# Patient Record
Sex: Male | Born: 1965 | Race: White | Hispanic: No | Marital: Married | Smoking: Former smoker
Health system: Southern US, Community
[De-identification: ages and names within clinical notes are randomized; demographics above are authoritative.]

## PROBLEM LIST (undated history)

## (undated) DIAGNOSIS — I10 Essential (primary) hypertension: Secondary | ICD-10-CM

## (undated) DIAGNOSIS — I2510 Atherosclerotic heart disease of native coronary artery without angina pectoris: Secondary | ICD-10-CM

## (undated) DIAGNOSIS — E669 Obesity, unspecified: Secondary | ICD-10-CM

## (undated) DIAGNOSIS — E785 Hyperlipidemia, unspecified: Secondary | ICD-10-CM

## (undated) DIAGNOSIS — E119 Type 2 diabetes mellitus without complications: Secondary | ICD-10-CM

## (undated) HISTORY — PX: CORONARY ANGIOPLASTY WITH STENT PLACEMENT: SHX49

## (undated) HISTORY — PX: LAPAROSCOPIC CHOLECYSTECTOMY: SUR755

---

## 2005-12-10 ENCOUNTER — Ambulatory Visit: Payer: Self-pay | Admitting: Family Medicine

## 2005-12-11 ENCOUNTER — Ambulatory Visit: Payer: Self-pay | Admitting: *Deleted

## 2005-12-24 ENCOUNTER — Emergency Department (HOSPITAL_COMMUNITY): Admission: EM | Admit: 2005-12-24 | Discharge: 2005-12-24 | Payer: Self-pay | Admitting: Emergency Medicine

## 2006-01-03 ENCOUNTER — Ambulatory Visit: Payer: Self-pay | Admitting: Family Medicine

## 2006-01-03 ENCOUNTER — Inpatient Hospital Stay (HOSPITAL_COMMUNITY): Admission: EM | Admit: 2006-01-03 | Discharge: 2006-01-05 | Payer: Self-pay | Admitting: Emergency Medicine

## 2006-01-03 DIAGNOSIS — I2510 Atherosclerotic heart disease of native coronary artery without angina pectoris: Secondary | ICD-10-CM | POA: Diagnosis present

## 2006-01-03 DIAGNOSIS — E119 Type 2 diabetes mellitus without complications: Secondary | ICD-10-CM

## 2006-01-03 DIAGNOSIS — E785 Hyperlipidemia, unspecified: Secondary | ICD-10-CM | POA: Diagnosis present

## 2006-01-03 DIAGNOSIS — E669 Obesity, unspecified: Secondary | ICD-10-CM | POA: Diagnosis present

## 2006-01-03 DIAGNOSIS — I10 Essential (primary) hypertension: Secondary | ICD-10-CM | POA: Diagnosis present

## 2006-05-29 IMAGING — CT CT ANGIO CHEST
2 of 5 series · 19 of 36 positions shown · IV contrast ([ID] 350)
Comparison: None.

CLINICAL DATA: Chest pain. Short of breath
 CT ANGIOGRAPHY OF CHEST:
TECHNIQUE: Multidetector CT imaging of the chest was performed during bolus injection of intravenous contrast.  Multiplanar CT angiographic image reconstructions were generated to evaluate the vascular anatomy.
 Contrast:  150 cc Omnipaque 350.

[Series 3: pe · axial · 0.82mm/px · z∈[-345,-52]mm · 18 of 519 slices shown]
[im 25/519  lung]
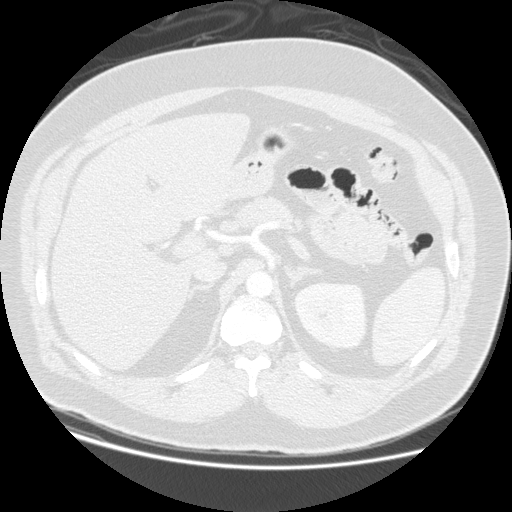
[im 50/519  mediastinal]
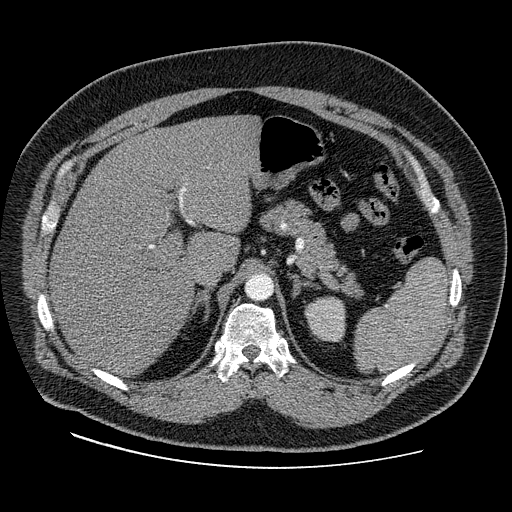
[im 75/519  lung]
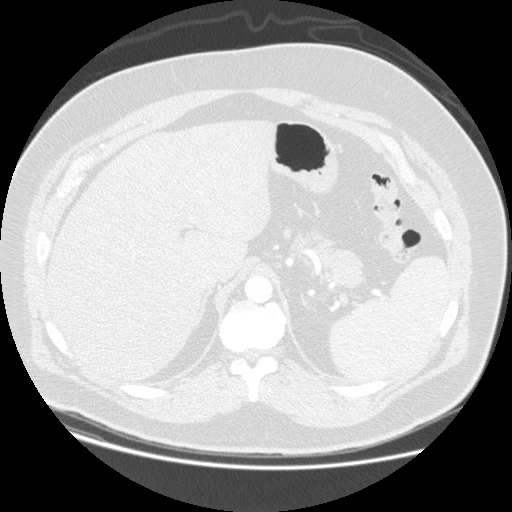
[im 124/519  mediastinal]
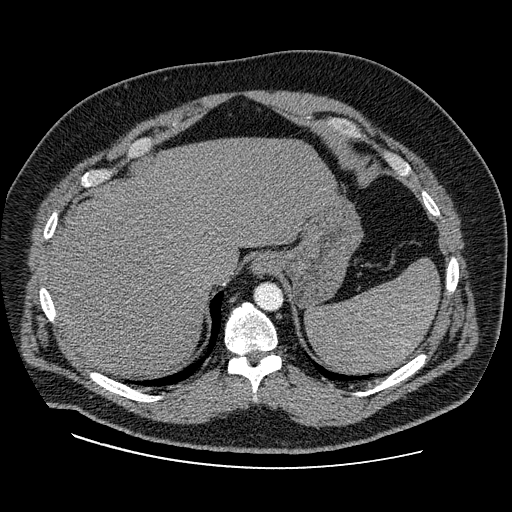
[im 149/519  lung]
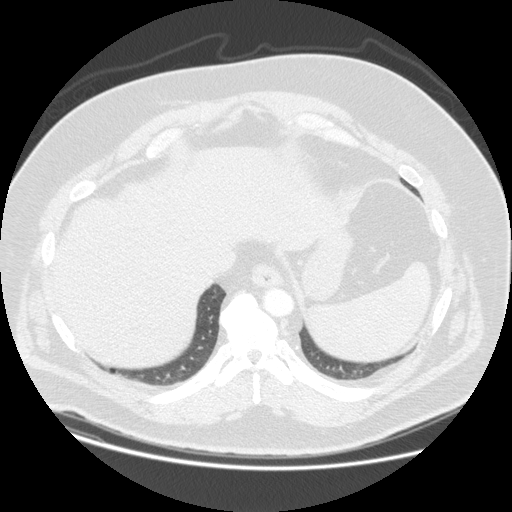
[im 173/519  mediastinal]
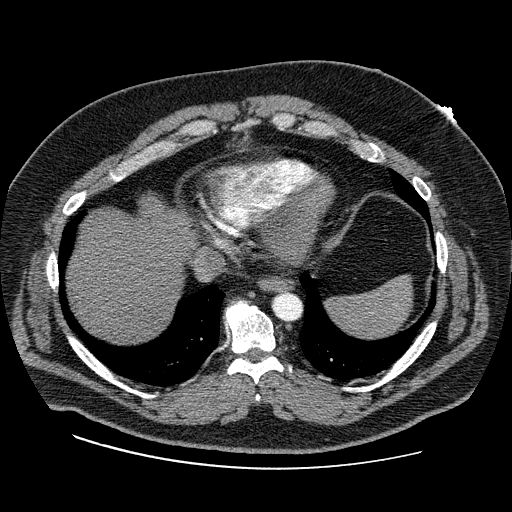
[im 198/519  lung]
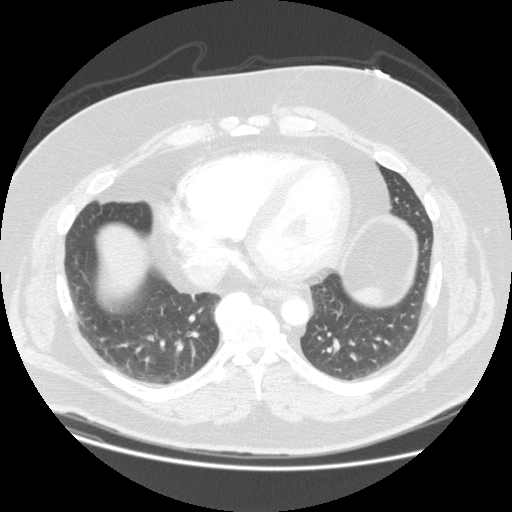
[im 223/519  mediastinal]
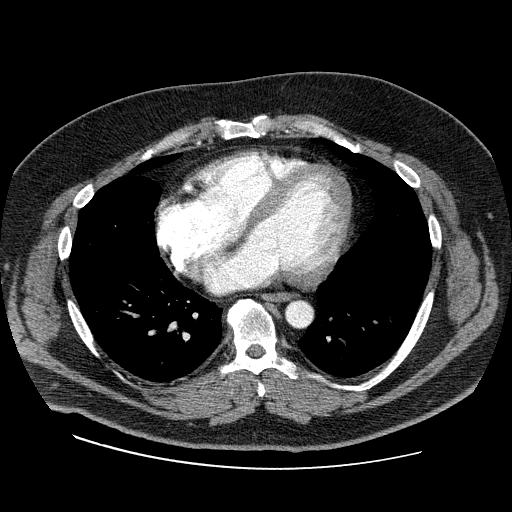
[im 247/519  lung]
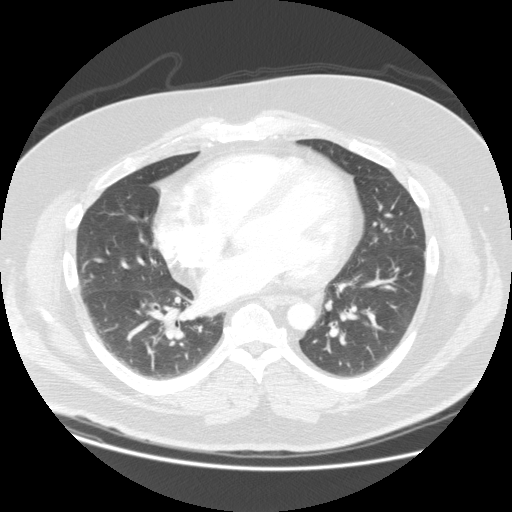
[im 297/519  mediastinal]
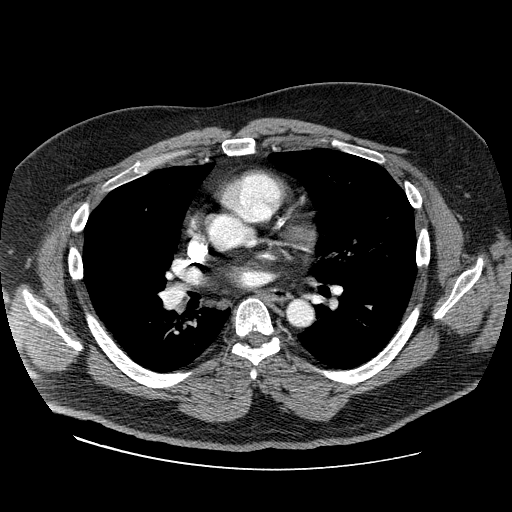
[im 303/519  lung]
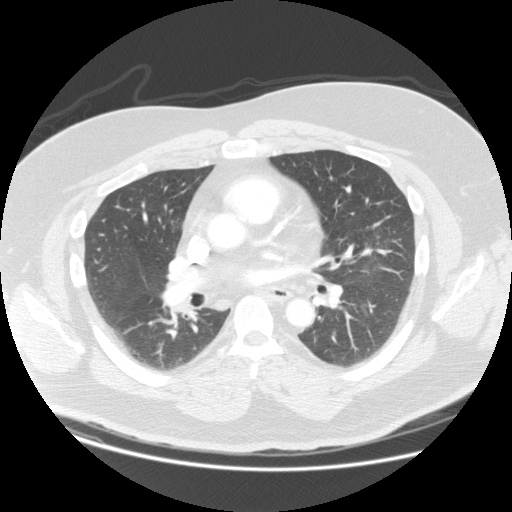
[im 321/519  mediastinal]
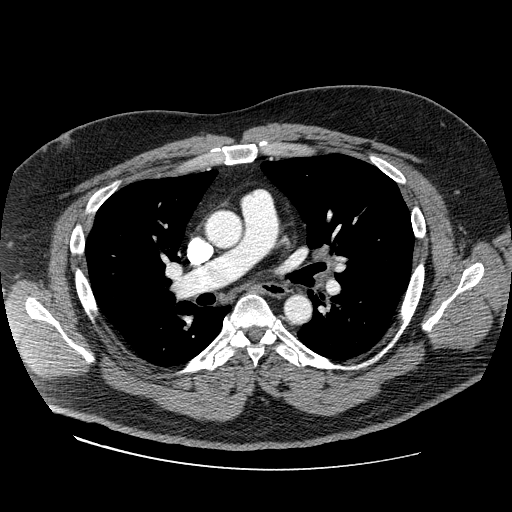
[im 346/519  lung]
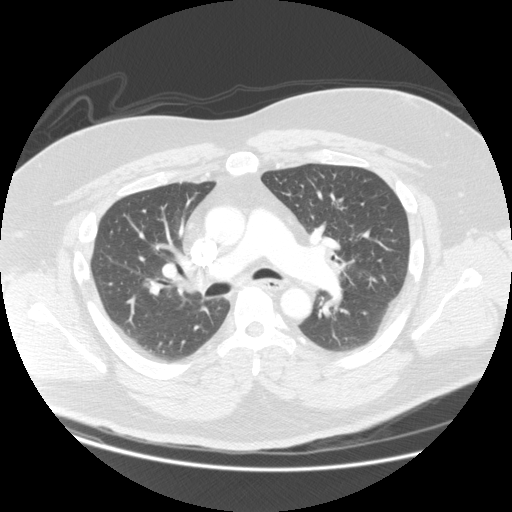
[im 371/519  mediastinal]
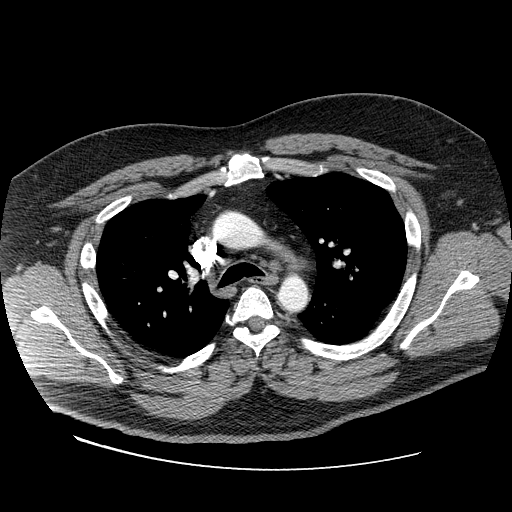
[im 395/519  lung]
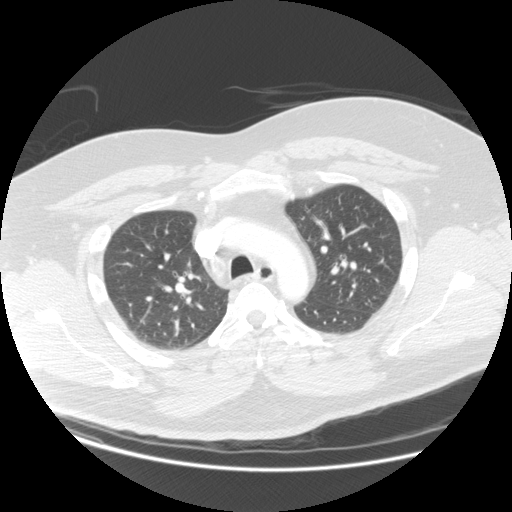
[im 445/519  mediastinal]
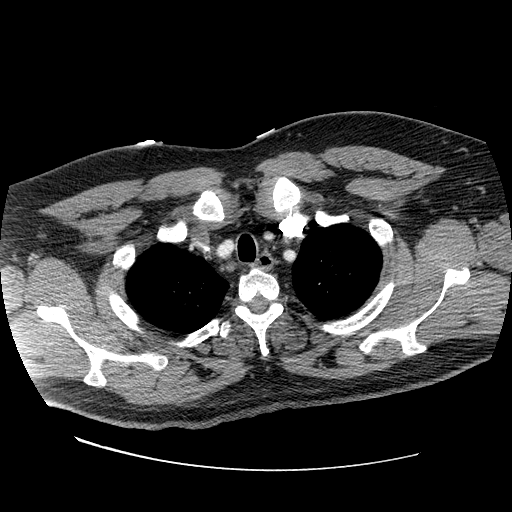
[im 469/519  lung]
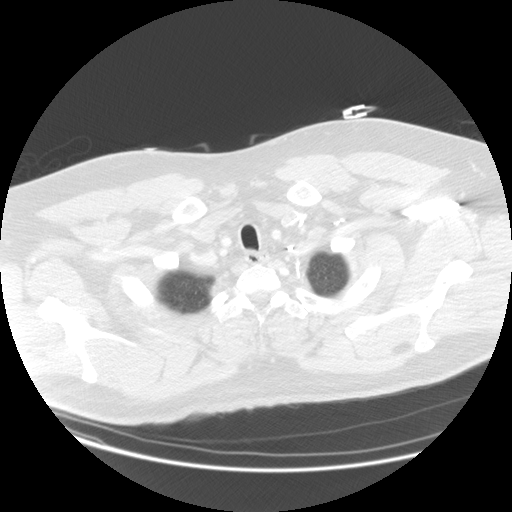
[im 494/519  mediastinal]
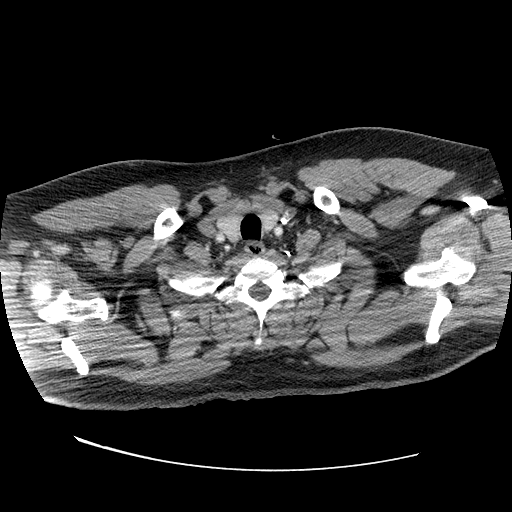

[Series 300: reformatted · sagittal · 0.82mm/px · 1 of 179 slices shown]
[im 90/179  mediastinal]
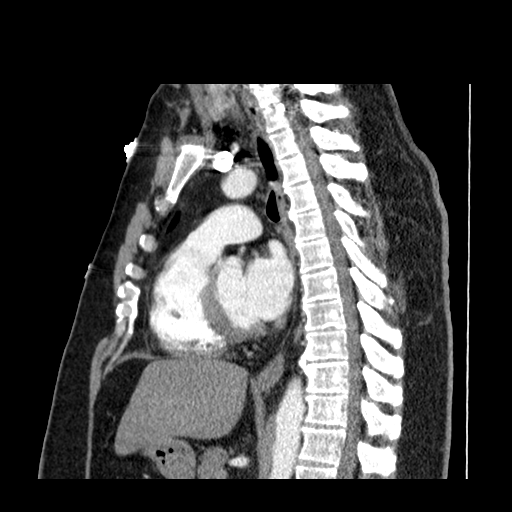

[19 of 36 positions shown; findings below may reference images not displayed]

FINDINGS: There is somewhat suboptimal filling of the pulmonary arterial tree with contrast.  No obvious filling defects are seen to suggest pulmonary thromboembolism.  
 Prominent mediastinal fat is present.  There is an 11 mm short-axis diameter right paratracheal lymph node on image 32.  Other smaller mediastinal nodes are seen.  Prominent left hilar nodes are seen.  A right hilar lymph node conglomeration on image 46 measures 2.6 x 1.7 cm.  Negative pericardial effusion.  Coronary artery calcifications are seen.  The heart is moderately enlarged.  
 No pneumothoraces or effusions are seen.  
 No focal consolidation or pulmonary mass is seen.
IMPRESSION: 1.  No evidence of pulmonary thromboembolism. 
 2.  Mediastinal lipomatosis. 
 3.  Mild hilar and mediastinal adenopathy.  Followup in three months. 
 4.  Coronary artery calcifications. 
 5.  Cardiomegaly.

## 2015-03-20 ENCOUNTER — Inpatient Hospital Stay (HOSPITAL_COMMUNITY)
Admission: EM | Admit: 2015-03-20 | Discharge: 2015-03-22 | DRG: 281 | Disposition: A | Discharge status: Home / Self Care | Payer: No Typology Code available for payment source | Attending: Internal Medicine | Admitting: Internal Medicine

## 2015-03-20 ENCOUNTER — Emergency Department (HOSPITAL_COMMUNITY): Payer: No Typology Code available for payment source

## 2015-03-20 ENCOUNTER — Encounter (HOSPITAL_COMMUNITY): Payer: Self-pay | Admitting: *Deleted

## 2015-03-20 DIAGNOSIS — F410 Panic disorder [episodic paroxysmal anxiety] without agoraphobia: Secondary | ICD-10-CM | POA: Diagnosis not present

## 2015-03-20 DIAGNOSIS — Z6837 Body mass index (BMI) 37.0-37.9, adult: Secondary | ICD-10-CM

## 2015-03-20 DIAGNOSIS — Z955 Presence of coronary angioplasty implant and graft: Secondary | ICD-10-CM | POA: Diagnosis not present

## 2015-03-20 DIAGNOSIS — I110 Hypertensive heart disease with heart failure: Secondary | ICD-10-CM | POA: Diagnosis present

## 2015-03-20 DIAGNOSIS — I10 Essential (primary) hypertension: Secondary | ICD-10-CM | POA: Diagnosis present

## 2015-03-20 DIAGNOSIS — I447 Left bundle-branch block, unspecified: Secondary | ICD-10-CM | POA: Diagnosis present

## 2015-03-20 DIAGNOSIS — E119 Type 2 diabetes mellitus without complications: Secondary | ICD-10-CM | POA: Diagnosis present

## 2015-03-20 DIAGNOSIS — E785 Hyperlipidemia, unspecified: Secondary | ICD-10-CM | POA: Diagnosis present

## 2015-03-20 DIAGNOSIS — E669 Obesity, unspecified: Secondary | ICD-10-CM | POA: Diagnosis present

## 2015-03-20 DIAGNOSIS — Z87891 Personal history of nicotine dependence: Secondary | ICD-10-CM | POA: Diagnosis not present

## 2015-03-20 DIAGNOSIS — Z7982 Long term (current) use of aspirin: Secondary | ICD-10-CM | POA: Diagnosis not present

## 2015-03-20 DIAGNOSIS — I25110 Atherosclerotic heart disease of native coronary artery with unstable angina pectoris: Secondary | ICD-10-CM | POA: Diagnosis not present

## 2015-03-20 DIAGNOSIS — I255 Ischemic cardiomyopathy: Secondary | ICD-10-CM | POA: Diagnosis present

## 2015-03-20 DIAGNOSIS — I2510 Atherosclerotic heart disease of native coronary artery without angina pectoris: Secondary | ICD-10-CM | POA: Diagnosis present

## 2015-03-20 DIAGNOSIS — I5022 Chronic systolic (congestive) heart failure: Secondary | ICD-10-CM | POA: Diagnosis not present

## 2015-03-20 DIAGNOSIS — F1320 Sedative, hypnotic or anxiolytic dependence, uncomplicated: Secondary | ICD-10-CM | POA: Diagnosis not present

## 2015-03-20 DIAGNOSIS — R079 Chest pain, unspecified: Secondary | ICD-10-CM

## 2015-03-20 DIAGNOSIS — Z91013 Allergy to seafood: Secondary | ICD-10-CM

## 2015-03-20 DIAGNOSIS — E876 Hypokalemia: Secondary | ICD-10-CM | POA: Diagnosis not present

## 2015-03-20 DIAGNOSIS — F1020 Alcohol dependence, uncomplicated: Secondary | ICD-10-CM | POA: Diagnosis not present

## 2015-03-20 DIAGNOSIS — W07XXXA Fall from chair, initial encounter: Secondary | ICD-10-CM | POA: Diagnosis present

## 2015-03-20 DIAGNOSIS — Z79899 Other long term (current) drug therapy: Secondary | ICD-10-CM

## 2015-03-20 DIAGNOSIS — I214 Non-ST elevation (NSTEMI) myocardial infarction: Secondary | ICD-10-CM | POA: Diagnosis present

## 2015-03-20 DIAGNOSIS — R7989 Other specified abnormal findings of blood chemistry: Secondary | ICD-10-CM

## 2015-03-20 DIAGNOSIS — I509 Heart failure, unspecified: Secondary | ICD-10-CM | POA: Diagnosis not present

## 2015-03-20 DIAGNOSIS — M549 Dorsalgia, unspecified: Secondary | ICD-10-CM | POA: Diagnosis not present

## 2015-03-20 DIAGNOSIS — Z794 Long term (current) use of insulin: Secondary | ICD-10-CM

## 2015-03-20 DIAGNOSIS — Z9889 Other specified postprocedural states: Secondary | ICD-10-CM | POA: Diagnosis not present

## 2015-03-20 DIAGNOSIS — Z9861 Coronary angioplasty status: Secondary | ICD-10-CM

## 2015-03-20 DIAGNOSIS — R002 Palpitations: Secondary | ICD-10-CM | POA: Diagnosis present

## 2015-03-20 DIAGNOSIS — R0789 Other chest pain: Secondary | ICD-10-CM | POA: Diagnosis not present

## 2015-03-20 DIAGNOSIS — Z7902 Long term (current) use of antithrombotics/antiplatelets: Secondary | ICD-10-CM | POA: Diagnosis not present

## 2015-03-20 DIAGNOSIS — F332 Major depressive disorder, recurrent severe without psychotic features: Secondary | ICD-10-CM | POA: Diagnosis not present

## 2015-03-20 DIAGNOSIS — R61 Generalized hyperhidrosis: Secondary | ICD-10-CM | POA: Diagnosis not present

## 2015-03-20 HISTORY — DX: Obesity, unspecified: E66.9

## 2015-03-20 HISTORY — DX: Essential (primary) hypertension: I10

## 2015-03-20 HISTORY — DX: Atherosclerotic heart disease of native coronary artery without angina pectoris: I25.10

## 2015-03-20 HISTORY — DX: Type 2 diabetes mellitus without complications: E11.9

## 2015-03-20 HISTORY — DX: Hyperlipidemia, unspecified: E78.5

## 2015-03-20 LAB — I-STAT TROPONIN, ED: TROPONIN I, POC: 0.23 ng/mL — AB (ref 0.00–0.08)

## 2015-03-20 LAB — BASIC METABOLIC PANEL
Anion gap: 16 — ABNORMAL HIGH (ref 5–15)
BUN: 12 mg/dL (ref 6–20)
CO2: 20 mmol/L — ABNORMAL LOW (ref 22–32)
Calcium: 9.4 mg/dL (ref 8.9–10.3)
Chloride: 100 mmol/L — ABNORMAL LOW (ref 101–111)
Creatinine, Ser: 0.84 mg/dL (ref 0.61–1.24)
GFR calc Af Amer: >60 mL/min (ref >60)
GLUCOSE: 188 mg/dL — AB (ref 65–99)
Potassium: 3.8 mmol/L (ref 3.5–5.1)
SODIUM: 136 mmol/L (ref 135–145)

## 2015-03-20 LAB — CBC
HCT: 42.8 % (ref 39.0–52.0)
HEMOGLOBIN: 14.5 g/dL (ref 13.0–17.0)
MCH: 31.9 pg (ref 26.0–34.0)
MCHC: 33.9 g/dL (ref 30.0–36.0)
MCV: 94.1 fL (ref 78.0–100.0)
Platelets: 243 10*3/uL (ref 150–400)
RBC: 4.55 MIL/uL (ref 4.22–5.81)
RDW: 15.2 % (ref 11.5–15.5)
WBC: 6.6 10*3/uL (ref 4.0–10.5)

## 2015-03-20 LAB — TROPONIN I: Troponin I: 0.28 ng/mL — ABNORMAL HIGH (ref <0.031)

## 2015-03-20 LAB — GLUCOSE, CAPILLARY: GLUCOSE-CAPILLARY: 137 mg/dL — AB (ref 65–99)

## 2015-03-20 MED ORDER — LISINOPRIL 40 MG PO TABS
40.0000 mg | ORAL_TABLET | Freq: Every morning | ORAL | Status: DC
  Administered 2015-03-21 – 2015-03-22 (×2): 40 mg via ORAL
  Filled 2015-03-20 (×2): qty 1

## 2015-03-20 MED ORDER — ASPIRIN EC 81 MG PO TBEC: 81.0000 mg | DELAYED_RELEASE_TABLET | Freq: Every day | ORAL | Status: DC

## 2015-03-20 MED ORDER — INSULIN ASPART 100 UNIT/ML ~~LOC~~ SOLN
0.0000 [IU] | Freq: Three times a day (TID) | SUBCUTANEOUS | Status: DC
  Administered 2015-03-21 (×3): 2 [IU] via SUBCUTANEOUS
  Administered 2015-03-22 (×2): 3 [IU] via SUBCUTANEOUS

## 2015-03-20 MED ORDER — FLUOXETINE HCL 20 MG PO CAPS
20.0000 mg | ORAL_CAPSULE | Freq: Every morning | ORAL | Status: DC
  Administered 2015-03-21 – 2015-03-22 (×2): 20 mg via ORAL
  Filled 2015-03-20 (×2): qty 1

## 2015-03-20 MED ORDER — NITROGLYCERIN 0.4 MG SL SUBL: 0.4000 mg | SUBLINGUAL_TABLET | SUBLINGUAL | Status: DC | PRN

## 2015-03-20 MED ORDER — METOPROLOL TARTRATE 1 MG/ML IV SOLN
5.0000 mg | Freq: Once | INTRAVENOUS | Status: AC
  Administered 2015-03-20: 5 mg via INTRAVENOUS
  Filled 2015-03-20: qty 5

## 2015-03-20 MED ORDER — ATORVASTATIN CALCIUM 40 MG PO TABS
40.0000 mg | ORAL_TABLET | Freq: Every evening | ORAL | Status: DC
  Administered 2015-03-20 – 2015-03-21 (×2): 40 mg via ORAL
  Filled 2015-03-20 (×2): qty 1

## 2015-03-20 MED ORDER — MORPHINE SULFATE 4 MG/ML IJ SOLN
2.0000 mg | INTRAMUSCULAR | Status: DC | PRN
  Administered 2015-03-20: 4 mg via INTRAVENOUS
  Administered 2015-03-21: 2 mg via INTRAVENOUS
  Administered 2015-03-21 (×2): 4 mg via INTRAVENOUS
  Filled 2015-03-20 (×4): qty 1

## 2015-03-20 MED ORDER — NITROGLYCERIN 0.4 MG SL SUBL
0.4000 mg | SUBLINGUAL_TABLET | SUBLINGUAL | Status: AC | PRN
  Administered 2015-03-20 (×3): 0.4 mg via SUBLINGUAL
  Filled 2015-03-20: qty 1

## 2015-03-20 MED ORDER — ASPIRIN 81 MG PO CHEW
324.0000 mg | CHEWABLE_TABLET | ORAL | Status: AC
  Administered 2015-03-20: 324 mg via ORAL
  Filled 2015-03-20: qty 4

## 2015-03-20 MED ORDER — SPIRONOLACTONE 25 MG PO TABS
25.0000 mg | ORAL_TABLET | Freq: Every day | ORAL | Status: DC
  Administered 2015-03-22: 25 mg via ORAL
  Filled 2015-03-20: qty 1

## 2015-03-20 MED ORDER — INSULIN DETEMIR 100 UNIT/ML ~~LOC~~ SOLN
50.0000 [IU] | Freq: Two times a day (BID) | SUBCUTANEOUS | Status: DC
  Administered 2015-03-20: 25 [IU] via SUBCUTANEOUS
  Administered 2015-03-21 – 2015-03-22 (×2): 50 [IU] via SUBCUTANEOUS
  Filled 2015-03-20 (×5): qty 0.5

## 2015-03-20 MED ORDER — METOPROLOL SUCCINATE ER 50 MG PO TB24
50.0000 mg | ORAL_TABLET | Freq: Every morning | ORAL | Status: DC
  Administered 2015-03-21 – 2015-03-22 (×2): 50 mg via ORAL
  Filled 2015-03-20 (×2): qty 1

## 2015-03-20 MED ORDER — ONDANSETRON HCL 4 MG/2ML IJ SOLN: 4.0000 mg | Freq: Four times a day (QID) | INTRAMUSCULAR | Status: DC | PRN

## 2015-03-20 MED ORDER — MORPHINE SULFATE 4 MG/ML IJ SOLN
4.0000 mg | Freq: Once | INTRAMUSCULAR | Status: AC
Start: 2015-03-20 — End: 2015-03-20
  Administered 2015-03-20: 4 mg via INTRAVENOUS
  Filled 2015-03-20: qty 1

## 2015-03-20 MED ORDER — HEPARIN (PORCINE) IN NACL 100-0.45 UNIT/ML-% IJ SOLN
1400.0000 [IU]/h | INTRAMUSCULAR | Status: DC
  Administered 2015-03-20: 1400 [IU]/h via INTRAVENOUS
  Filled 2015-03-20: qty 250

## 2015-03-20 MED ORDER — ASPIRIN 300 MG RE SUPP: 300.0000 mg | RECTAL | Status: AC

## 2015-03-20 MED ORDER — ACETAMINOPHEN 325 MG PO TABS
650.0000 mg | ORAL_TABLET | ORAL | Status: DC | PRN
  Administered 2015-03-21: 650 mg via ORAL
  Filled 2015-03-20: qty 2

## 2015-03-20 MED ORDER — CHLORHEXIDINE GLUCONATE CLOTH 2 % EX PADS: 6.0000 | MEDICATED_PAD | Freq: Every day | CUTANEOUS | Status: DC

## 2015-03-20 MED ORDER — ASPIRIN EC 81 MG PO TBEC: 81.0000 mg | DELAYED_RELEASE_TABLET | Freq: Every morning | ORAL | Status: DC

## 2015-03-20 MED ORDER — CLOPIDOGREL BISULFATE 75 MG PO TABS
75.0000 mg | ORAL_TABLET | Freq: Every day | ORAL | Status: DC
  Administered 2015-03-21 – 2015-03-22 (×2): 75 mg via ORAL
  Filled 2015-03-20 (×2): qty 1

## 2015-03-20 MED ORDER — HEPARIN BOLUS VIA INFUSION
4000.0000 [IU] | INTRAVENOUS | Status: AC
Start: 2015-03-20 — End: 2015-03-20
  Administered 2015-03-20: 4000 [IU] via INTRAVENOUS

## 2015-03-20 MED ORDER — PANTOPRAZOLE SODIUM 40 MG PO TBEC
40.0000 mg | DELAYED_RELEASE_TABLET | Freq: Every day | ORAL | Status: DC
  Administered 2015-03-21 – 2015-03-22 (×2): 40 mg via ORAL
  Filled 2015-03-20 (×2): qty 1

## 2015-03-20 MED ORDER — NITROGLYCERIN IN D5W 200-5 MCG/ML-% IV SOLN
10.0000 ug/min | INTRAVENOUS | Status: DC
  Administered 2015-03-20: 10 ug/min via INTRAVENOUS
  Filled 2015-03-20: qty 250

## 2015-03-20 MED ORDER — ISOSORBIDE MONONITRATE ER 60 MG PO TB24
60.0000 mg | ORAL_TABLET | Freq: Every morning | ORAL | Status: DC
  Administered 2015-03-21 – 2015-03-22 (×2): 60 mg via ORAL
  Filled 2015-03-20 (×2): qty 1

## 2015-03-20 MED ORDER — CHLORHEXIDINE GLUCONATE CLOTH 2 % EX PADS
6.0000 | MEDICATED_PAD | Freq: Once | CUTANEOUS | Status: AC
  Administered 2015-03-20: 6 via TOPICAL

## 2015-03-20 NOTE — ED Notes (Signed)
Patient transported to X-ray 

## 2015-03-20 NOTE — ED Notes (Addendum)
Dr Donnald GarrePfeiffer made aware of abnormal troponin.

## 2015-03-20 NOTE — ED Notes (Signed)
Dr Pfeiffer made aware of abnormal troponin. 

## 2015-03-20 NOTE — Progress Notes (Addendum)
ANTICOAGULATION CONSULT NOTE - Initial Consult  Pharmacy Consult for heparin  Indication: chest pain/ACS  Allergies  Allergen Reactions  . Shellfish Allergy Swelling    Patient Measurements:   Heparin Dosing Weight: 100kg  Vital Signs: Temp: 98.8 F (37.1 C) (05/22 1630) Temp Source: Oral (05/22 1630) BP: 177/112 mmHg (05/22 1715) Pulse Rate: 124 (05/22 1715)  Labs:  Recent Labs  03/20/15 1658  HGB 14.5  HCT 42.8  PLT 243  CREATININE 0.84    CrCl cannot be calculated (Unknown ideal weight.).   Medical History: Past Medical History  Diagnosis Date  . Coronary artery disease   . Hypertension   . Diabetes mellitus without complication      Assessment: [age] YOM with h/o CAD (including stents) presents with chest pain.  Troponin elevated. Pharmacy asked to dose heparin.   Today, 03/20/2015:  CBC: Hgb and pltc WNL  No anticoagulants prior to admission per med hx  Goal of Therapy:  Heparin level 0.3-0.7 units/ml Monitor platelets by anticoagulation protocol: Yes   Plan:   Heparin 4000 unit bolus then 1400 units/hr  Check 6h heparin level   Daily heparin level and CBC  Monitor for bleeding  Juliette Alcideustin Jhene Westmoreland, PharmD, BCPS.   Pager: [phone]  03/20/2015,5:31 PM

## 2015-03-20 NOTE — Progress Notes (Signed)
Pt arrived via carelink to 3W05.  Pt hypertensive and with CP and back pain 8/10, IV NTG titrated up with no relief and EKG obtained.   Pt now c/o 10/10 headache.  Pt remains hyptertensive with BP164/104, ST 104-110.  Dr. Donnie Ahoilley paged X 2 via answering service.

## 2015-03-20 NOTE — ED Provider Notes (Signed)
CSN: [id number]     Arrival date & time 03/20/15  1624 History   First MD Initiated Contact with Patient 03/20/15 1700     Chief Complaint  Patient presents with  . Palpitations   [facility] is a 49 y.o. male with a history of diabetes, coronary artery disease, multiple stents, and hypertension who presents the emergency department complaining of palpitations ongoing for the past 7 days as well as substernal chest pain starting around 12:30 PM today. The patient is complaining of substernal chest pressure that is nonradiating that he rates at an 8 out of 10. He reports his pain started while walking around the building outside today. Patient reports taking 325 mg of aspirin today. Patient reports history of benign stent was placed previously. He reports he sees [place] cardiology in [place]. He reports his last stent was done 2 weeks ago at [facility]. He is visiting [place] this weekend.  Patient also reports some dyspnea on exertion but no shortness of breath at rest. He complains of palpitations constant for the past 7 days. He reports he blacked out last week and fell on his buttocks. He reports going to [facility] 5 days ago where he was admitted for 1 night and then discharged. He reports he was still having palpitations at discharge. The patient denies fevers, chills, nausea, vomiting, abdominal pain, numbness, tingling or weakness.  (Consider location/radiation/quality/duration/timing/severity/associated sxs/prior Treatment) HPI  Past Medical History  Diagnosis Date  . Coronary artery disease   . Hypertension   . Diabetes mellitus without complication    Past Surgical History  Procedure Laterality Date  . Coronary angioplasty with stent placement     No family history on file. History  Substance Use Topics  . Smoking status: Never Smoker   . Smokeless tobacco: Not on file  . Alcohol Use: No    Review of Systems  Constitutional:  Negative for fever and chills.  HENT: Negative for congestion and sore throat.   Eyes: Negative for pain and visual disturbance.  Respiratory: Positive for shortness of breath. Negative for cough and wheezing.   Cardiovascular: Positive for chest pain and palpitations. Negative for leg swelling.  Gastrointestinal: Negative for nausea, vomiting, abdominal pain and diarrhea.  Genitourinary: Negative for dysuria.  Musculoskeletal: Negative for back pain and neck pain.       Buttocks pain  Skin: Negative for rash.  Neurological: Positive for light-headedness. Negative for weakness, numbness and headaches.      Allergies  Shellfish allergy  Home Medications   Prior to Admission medications   Medication Sig Start Date End Date Taking? Authorizing Provider  aspirin EC 81 MG tablet Take 81 mg by mouth every morning.   Yes Historical Provider, MD  atorvastatin (LIPITOR) 40 MG tablet Take 40 mg by mouth every evening.   Yes Historical Provider, MD  eplerenone (INSPRA) 50 MG tablet Take 50 mg by mouth every evening.   Yes Historical Provider, MD  FLUoxetine (PROZAC) 20 MG capsule Take 20 mg by mouth every morning.   Yes Historical Provider, MD  insulin detemir (LEVEMIR) 100 UNIT/ML injection Inject 50 Units into the skin 2 (two) times daily.   Yes Historical Provider, MD  isosorbide mononitrate (IMDUR) 60 MG 24 hr tablet Take 60 mg by mouth every morning.   Yes Historical Provider, MD  lisinopril (PRINIVIL,ZESTRIL) 40 MG tablet Take 40 mg by mouth every morning.   Yes Historical Provider, MD  metFORMIN (GLUCOPHAGE) 1000 MG tablet Take  1,000 mg by mouth 2 (two) times daily with a meal.   Yes Historical Provider, MD  metoprolol succinate (TOPROL-XL) 50 MG 24 hr tablet Take 50 mg by mouth every morning. Take with or immediately following a meal.   Yes Historical Provider, MD   BP 177/112 mmHg  Pulse 124  Temp(Src) 98.8 F (37.1 C) (Oral)  Resp 24  Ht  (1.803 m)  Wt 250 lb (113.399 kg)   BMI 34.88 kg/m2  SpO2 99% Physical Exam  Constitutional: He is oriented to person, place, and time. He appears well-developed and well-nourished. No distress.  Obese male. Nontoxic appearing.  HENT:  Head: Normocephalic and atraumatic.  Mouth/Throat: Oropharynx is clear and moist. No oropharyngeal exudate.  Eyes: Conjunctivae are normal. Pupils are equal, round, and reactive to light. Right eye exhibits no discharge. Left eye exhibits no discharge.  Neck: Neck supple. No JVD present. No tracheal deviation present.  Cardiovascular: Regular rhythm, normal heart sounds and intact distal pulses.  Exam reveals no gallop and no friction rub.   No murmur heard. Tachycardic with a heart rate of 112. Bilateral radial, posterior tibialis and dorsalis pedis pulses are intact.    Pulmonary/Chest: Effort normal and breath sounds normal. No respiratory distress. He has no wheezes. He has no rales. He exhibits no tenderness.  Lungs are clear to auscultation bilaterally. No chest tenderness.  Abdominal: Soft. Bowel sounds are normal. He exhibits no distension. There is no tenderness.  Abdomen is soft and nontender to palpation.  Musculoskeletal: He exhibits tenderness. He exhibits no edema.  No lower extremity edema or tenderness. Tenderness in area of ecchymosis to the patient's coccyx from a previous fall. No back edema, deformity or crepitus noted.  Lymphadenopathy:    He has no cervical adenopathy.  Neurological: He is alert and oriented to person, place, and time. Coordination normal.  Skin: Skin is warm and dry. No rash noted. He is not diaphoretic. No erythema. No pallor.  Psychiatric: He has a normal mood and affect. His behavior is normal.  Nursing note and vitals reviewed.   ED Course  Procedures (including critical care time) Labs Review Labs Reviewed  BASIC METABOLIC PANEL - Abnormal; Notable for the following:    Chloride 100 (*)    CO2 20 (*)    Glucose, Bld 188 (*)    Anion gap 16  (*)    All other components within normal limits  I-STAT TROPOININ, ED - Abnormal; Notable for the following:    Troponin i, poc 0.23 (*)    All other components within normal limits  CBC    Imaging Review No results found.   EKG Interpretation   Date/Time:  Sunday Mar 20 2015 16:32:52 EDT Ventricular Rate:  125 PR Interval:  136 QRS Duration: 124 QT Interval:  338 QTC Calculation: 487 R Axis:   -49 Text Interpretation:  Sinus tachycardia Nonspecific IVCD with LAD Consider  anterior infarct Abnormal T, consider ischemia, lateral leads Baseline  wander in lead(s) V4 agree. rate related changes c/w old. no STEMI  Confirmed by Donnald Garre, MD, Lebron Conners [phone]) on 03/20/2015 4:49:53 PM      Filed Vitals:   03/20/15 1659 03/20/15 1700 03/20/15 1715 03/20/15 1739  BP:  160/104 177/112   Pulse: 122 121 124   Temp:      TempSrc:      Resp: Height:     (1.803 m)  Weight:    250 lb (113.399 kg)  SpO2: 97% 97% 99%      MDM   Meds given in ED:  Medications  metoprolol (LOPRESSOR) injection 5 mg (not administered)  morphine 4 MG/ML injection 4 mg (not administered)  nitroGLYCERIN 50 mg in dextrose 5 % 250 mL (0.2 mg/mL) infusion (not administered)  heparin ADULT infusion 100 units/mL (25000 units/250 mL) (not administered)  heparin bolus via infusion 4,000 Units (not administered)  nitroGLYCERIN (NITROSTAT) SL tablet 0.4 mg (0.4 mg Sublingual Given 03/20/15 1738)    New Prescriptions   No medications on file    Final diagnoses:  Chest pain, unspecified chest pain type  Palpitations    This is a 49 y.o. male with a history of diabetes, coronary artery disease, multiple stents, and hypertension who presents the emergency department complaining of palpitations ongoing for the past 7 days as well as substernal chest pain starting around 12:30 PM today. The patient is complaining of substernal chest pressure that is nonradiating that he rates at an 8 out of 10. He  notes he passed out last week with his palpitations. He reports a history of 9 previous stents and left stents placed 2 weeks ago [facility]. On exam the patient is an obese male who is afebrile. He is tachycardic at a rate of 112. He is in sinus tachycardia on the monitor with a borderline left bundle blanch block and poor R-wave progression on EKG. His troponin is elevated at 0.23. Will start NTG, heparin drip and consult cardiology. Consulted with Dr. Donnie Ahoilley from cardiology who would like him to have [facility] and have [facility]. Dr. Johney FrameAllred is the admitting physician.  At second reevaluation the patient reports no relief with NTG and he has a headache. Dr. Donnald GarrePfeiffer ordered lopressor and morphine for pain control. Working on transfer. The patient is in agreement with admission.   This patient was discussed with and evaluated by Dr. Donnald GarrePfeiffer who agrees with assessment and plan.   CRITICAL CARE Performed by: Lawana Chambersansie,Atziry Baranski Duncan   Total critical care time: 35 minutes   Critical care time was exclusive of separately billable procedures and treating other patients.  Critical care was necessary to treat or prevent imminent or life-threatening deterioration.  Critical care was time spent personally by me on the following activities: development of treatment plan with patient and/or surrogate as well as nursing, discussions with consultants, evaluation of patient's response to treatment, examination of patient, obtaining history from patient or surrogate, ordering and performing treatments and interventions, ordering and review of laboratory studies, ordering and review of radiographic studies, pulse oximetry and re-evaluation of patient's condition.    Everlene FarrierWilliam Raymonda Pell, PA-C 03/20/15 2039  Arby BarretteMarcy Pfeiffer, MD 03/22/15 [phone]

## 2015-03-20 NOTE — ED Notes (Signed)
Pt reports elevated BP-200/70 and palpitations today.  Reports he's been having palpitations for the last 5 days and was seen at [facility] since.  Pt reports dizziness and lightheaded.

## 2015-03-20 NOTE — ED Notes (Signed)
[facility] called for transport. 

## 2015-03-20 NOTE — H&P (Signed)
History and Physical   Admit date: 03/20/2015 Name:  Jake KirschnerDavid Mayo Medical record number: [id number] DOB/Age:  Mar 07, 1966  49 y.o. male  Referring Physician: [facility]  Chief complaint/reason for admission: Palpitations, chest pain and shortness of breath.    HPI:  This 49 year old male is brought in for observation with chest pain, palpitations, and shortness of breath.  The patient is visiting his parents who live at [facility] from out of town.  He has a complicated past history.  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  He reports having [facility] st[facility] a number of years ago and states that he has 9 stents.  He reports having had 2 st[place] [facility] [place] for chest pain.  He has had shortness of breath and states he has diabetes as well as congestive heart failure.  He says that he lives alone and supports himself financially with support of his family.  He says he quit smoking a year or 2 ago.  He was [facility] stating that he became dizzy his heart was racing and he fell out of a chair and hurt his tailbone.  He had come to [place] to visit his parents and was walking around the complex this morning when he had chest discomfort and came in.  He evidently was checked by a nurse who told him he did not look good and he was sent to [facility].  He was complaining mainly of palpitations but also had some chest pain earlier and his troponin was drawn and was positive.  He was placed on heparin and nitroglycerin and [facility].  He at the present time is complaining that his heart is racing and complains of palpitations and he also complains of chest pain as well as shortness of breath as well as a headache.  It is hard to tell what the predominant complaint is.  He denies any history of drug  use.  Normally has shortness of breath and difficulty walking.  He states he was recently placed on metformin and has been having diarrhea.  Says that his heart has been racing for about a week and was associated with syncope previously.   Past Medical History  Diagnosis Date  . Coronary artery disease   . Hypertension   . Diabetes mellitus without complication   . Obesity (BMI 30-39.9)   . Hyperlipidemia      Past Surgical History  Procedure Laterality Date  . Coronary angioplasty with stent placement    . Laparoscopic cholecystectomy    .  Allergies: is allergic to shellfish allergy.   Medications: Prior to Admission medications   Medication Sig Start Date End Date Taking? Authorizing Provider  aspirin EC 81 MG tablet Take 81 mg by mouth every morning.   Yes Historical Provider, MD  atorvastatin (LIPITOR) 40 MG tablet Take 40 mg by mouth every evening.   Yes Historical Provider, MD  eplerenone (INSPRA) 50 MG tablet Take 50 mg by mouth every evening.   Yes Historical Provider, MD  FLUoxetine (PROZAC) 20 MG capsule Take 20 mg by mouth every morning.   Yes Historical Provider, MD  insulin detemir (LEVEMIR) 100 UNIT/ML injection Inject 50 Units into the skin 2 (two) times daily.   Yes Historical Provider, MD  isosorbide mononitrate (IMDUR) 60 MG 24 hr  tablet Take 60 mg by mouth every morning.   Yes Historical Provider, MD  lisinopril (PRINIVIL,ZESTRIL) 40 MG tablet Take 40 mg by mouth every morning.   Yes Historical Provider, MD  metFORMIN (GLUCOPHAGE) 1000 MG tablet Take 1,000 mg by mouth 2 (two) times daily with a meal.   Yes Historical Provider, MD  metoprolol succinate (TOPROL-XL) 50 MG 24 hr tablet Take 50 mg by mouth every morning. Take with or immediately following a meal.   Yes Historical Provider, MD    Family History:  Family Status  Relation Status Death Age  . Father Alive     dementia  . Mother Alive     Social History:   reports that he has quit smoking. His  smoking use included Cigarettes. He has a 30 pack-year smoking history. He has never used smokeless tobacco. He reports that he does not drink alcohol or use illicit drugs.   History   Social History Narrative   Truck driver        Review of Systems: Complains of chronic fatigue and malaise.  Chronic shortness of breath.  Has had diarrhea since being on metformin.  Did not bring all of his medications with him but states he has been compliant with [facility].  He does have recent vein stripping on his left leg that was done in [place] at some point.  No history of stroke or TIA.  Does have mild indigestion at times.  Also has nocturia. Other than as noted above, the remainder of the review of systems is normal  Physical Exam: BP 152/88 mmHg  Pulse 109  Temp(Src) 98.6 F (37 C) (Oral)  Resp 21  Ht  (1.803 m)  Wt 122.653 kg (270 lb 6.4 oz)  BMI 37.73 kg/m2  SpO2 96% General appearance: Obese white male who is uncomfortable lying in bed mainly due to low back pain Head: Normocephalic, without obvious abnormality, atraumatic Eyes: conjunctivae/corneas clear. PERRL, EOM's intact. Fundi not examined Neck: no adenopathy, no carotid bruit, no JVD and supple, symmetrical, trachea midline Lungs: clear to auscultation bilaterally Heart: regular rate and rhythm, S1, S2 normal, no murmur, click, rub or gallop Abdomen: soft, non-tender; bowel sounds normal; no masses,  no organomegaly Rectal: deferred Extremities: Left lower leg has significant varicose veins noted in the upper leg, healed anterior surgical scar with mild deformity, changes of chronic venous stasis in the lower leg, right leg with no edema Pulses: 2+ and symmetric Skin: Skin color, texture, turgor normal. No rashes or lesions Neurologic: Grossly normal  Labs: CBC  Recent Labs  03/20/15 1658  WBC 6.6  RBC 4.55  HGB 14.5  HCT 42.8  PLT 243  MCV 94.1  MCH 31.9  MCHC 33.9  RDW 15.2   CMP   Recent Labs   03/20/15 1658  NA 136  K 3.8  CL 100*  CO2 20*  GLUCOSE 188*  BUN 12  CREATININE 0.84  CALCIUM 9.4  GFRNONAA >60  GFRAA >60   BNP (last 3 results)  Cardiac Panel (last 3 results) Troponin (Point of Care Test)  Recent Labs  03/20/15 1702  TROPIPOC 0.23*    EKG: Left bundle branch block   Radiology: Cardiomegaly, no congestive heart failure   IMPRESSIONS: 1.  Prolonged chest discomfort with mildly elevated troponin in a patient with unknown stents to unknown vessels with previous stenting about 3 weeks ago 2.  Recent syncopal episode 3.  Left bundle branch block 4.  Diabetes mellitus 5.  Hypertensive  heart disease 6.  History of congestive heart failure 7.  Obesity  PLAN: Very difficult to clinically assess.  It is hard to tell whether his major complaint is chest pain headache or palpitations.  We'll cycle troponins and try to obtain medical records.  Keep nothing by mouth after midnight for further testing.  Obtain echocardiogram.  Signed: Darden Palmer MD [facility]  03/20/2015, 10:08 PM

## 2015-03-21 ENCOUNTER — Encounter (HOSPITAL_COMMUNITY): Payer: Self-pay | Admitting: Interventional Cardiology

## 2015-03-21 ENCOUNTER — Encounter (HOSPITAL_COMMUNITY)
Admission: EM | Disposition: A | Discharge status: Home / Self Care | Payer: No Typology Code available for payment source | Source: Home / Self Care | Attending: Internal Medicine

## 2015-03-21 ENCOUNTER — Inpatient Hospital Stay (HOSPITAL_COMMUNITY): Payer: No Typology Code available for payment source

## 2015-03-21 DIAGNOSIS — I509 Heart failure, unspecified: Secondary | ICD-10-CM

## 2015-03-21 DIAGNOSIS — R002 Palpitations: Secondary | ICD-10-CM | POA: Diagnosis present

## 2015-03-21 DIAGNOSIS — I25110 Atherosclerotic heart disease of native coronary artery with unstable angina pectoris: Secondary | ICD-10-CM

## 2015-03-21 DIAGNOSIS — E876 Hypokalemia: Secondary | ICD-10-CM

## 2015-03-21 HISTORY — PX: CARDIAC CATHETERIZATION: SHX172

## 2015-03-21 LAB — BASIC METABOLIC PANEL
ANION GAP: 12 (ref 5–15)
BUN: 9 mg/dL (ref 6–20)
CO2: 25 mmol/L (ref 22–32)
Calcium: 8.6 mg/dL — ABNORMAL LOW (ref 8.9–10.3)
Chloride: 100 mmol/L — ABNORMAL LOW (ref 101–111)
Creatinine, Ser: 0.8 mg/dL (ref 0.61–1.24)
GFR calc non Af Amer: >60 mL/min (ref >60)
Glucose, Bld: 154 mg/dL — ABNORMAL HIGH (ref 65–99)
Potassium: 3.3 mmol/L — ABNORMAL LOW (ref 3.5–5.1)
Sodium: 137 mmol/L (ref 135–145)

## 2015-03-21 LAB — TROPONIN I
Troponin I: 0.23 ng/mL — ABNORMAL HIGH (ref <0.031)
Troponin I: 0.17 ng/mL — ABNORMAL HIGH (ref <0.031)

## 2015-03-21 LAB — TSH: TSH: 1.375 u[IU]/mL (ref 0.350–4.500)

## 2015-03-21 LAB — LIPID PANEL
CHOL/HDL RATIO: 3.4 ratio
CHOLESTEROL: 141 mg/dL (ref 0–200)
HDL: 42 mg/dL (ref >40)
LDL CALC: 21 mg/dL (ref 0–99)
Triglycerides: 388 mg/dL — ABNORMAL HIGH (ref <150)
VLDL: 78 mg/dL — ABNORMAL HIGH (ref 0–40)

## 2015-03-21 LAB — GLUCOSE, CAPILLARY
Glucose-Capillary: 147 mg/dL — ABNORMAL HIGH (ref 65–99)
Glucose-Capillary: 145 mg/dL — ABNORMAL HIGH (ref 65–99)
Glucose-Capillary: 150 mg/dL — ABNORMAL HIGH (ref 65–99)
Glucose-Capillary: 151 mg/dL — ABNORMAL HIGH (ref 65–99)

## 2015-03-21 LAB — CBC
HCT: 39.2 % (ref 39.0–52.0)
Hemoglobin: 13.6 g/dL (ref 13.0–17.0)
MCH: 32.2 pg (ref 26.0–34.0)
MCHC: 34.7 g/dL (ref 30.0–36.0)
MCV: 92.7 fL (ref 78.0–100.0)
PLATELETS: 210 10*3/uL (ref 150–400)
RBC: 4.23 MIL/uL (ref 4.22–5.81)
RDW: 14.7 % (ref 11.5–15.5)
WBC: 4.3 10*3/uL (ref 4.0–10.5)

## 2015-03-21 LAB — PROTIME-INR
INR: 1.15 (ref 0.00–1.49)
PROTHROMBIN TIME: 14.9 s (ref 11.6–15.2)

## 2015-03-21 LAB — HEPARIN LEVEL (UNFRACTIONATED)
HEPARIN UNFRACTIONATED: 0.29 [IU]/mL — AB (ref 0.30–0.70)
HEPARIN UNFRACTIONATED: 0.1 [IU]/mL — AB (ref 0.30–0.70)

## 2015-03-21 LAB — MRSA PCR SCREENING: MRSA by PCR: NEGATIVE

## 2015-03-21 SURGERY — LEFT HEART CATH AND CORONARY ANGIOGRAPHY

## 2015-03-21 SURGERY — LEFT HEART CATH AND CORONARY ANGIOGRAPHY
Anesthesia: LOCAL

## 2015-03-21 MED ORDER — ENOXAPARIN SODIUM 40 MG/0.4ML ~~LOC~~ SOLN
40.0000 mg | SUBCUTANEOUS | Status: DC
  Administered 2015-03-22: 40 mg via SUBCUTANEOUS
  Filled 2015-03-21: qty 0.4

## 2015-03-21 MED ORDER — SODIUM CHLORIDE 0.9 % IV SOLN: 250.0000 mL | INTRAVENOUS | Status: DC | PRN

## 2015-03-21 MED ORDER — ONDANSETRON HCL 4 MG/2ML IJ SOLN: 4.0000 mg | Freq: Four times a day (QID) | INTRAMUSCULAR | Status: DC | PRN

## 2015-03-21 MED ORDER — FENTANYL CITRATE (PF) 100 MCG/2ML IJ SOLN
INTRAMUSCULAR | Status: AC
  Filled 2015-03-21: qty 2

## 2015-03-21 MED ORDER — FENTANYL CITRATE (PF) 100 MCG/2ML IJ SOLN
INTRAMUSCULAR | Status: DC | PRN
  Administered 2015-03-21 (×3): 50 ug via INTRAVENOUS

## 2015-03-21 MED ORDER — OXYCODONE-ACETAMINOPHEN 5-325 MG PO TABS
1.0000 | ORAL_TABLET | ORAL | Status: DC | PRN
  Administered 2015-03-21 – 2015-03-22 (×6): 2 via ORAL
  Filled 2015-03-21 (×6): qty 2

## 2015-03-21 MED ORDER — SODIUM CHLORIDE 0.9 % IJ SOLN: 3.0000 mL | INTRAMUSCULAR | Status: DC | PRN

## 2015-03-21 MED ORDER — HEPARIN (PORCINE) IN NACL 100-0.45 UNIT/ML-% IJ SOLN
1700.0000 [IU]/h | INTRAMUSCULAR | Status: DC
  Administered 2015-03-21: 1700 [IU]/h via INTRAVENOUS
  Filled 2015-03-21: qty 250

## 2015-03-21 MED ORDER — HEPARIN BOLUS VIA INFUSION
3000.0000 [IU] | Freq: Once | INTRAVENOUS | Status: AC
  Administered 2015-03-21: 3000 [IU] via INTRAVENOUS
  Filled 2015-03-21: qty 3000

## 2015-03-21 MED ORDER — CLOPIDOGREL BISULFATE 75 MG PO TABS: 75.0000 mg | ORAL_TABLET | Freq: Every day | ORAL | Status: DC

## 2015-03-21 MED ORDER — HEPARIN SODIUM (PORCINE) 1000 UNIT/ML IJ SOLN
INTRAMUSCULAR | Status: AC
  Filled 2015-03-21: qty 1

## 2015-03-21 MED ORDER — MIDAZOLAM HCL 2 MG/2ML IJ SOLN
INTRAMUSCULAR | Status: AC
  Filled 2015-03-21: qty 2

## 2015-03-21 MED ORDER — INSULIN DETEMIR 100 UNIT/ML ~~LOC~~ SOLN
25.0000 [IU] | Freq: Once | SUBCUTANEOUS | Status: AC
  Administered 2015-03-21: 25 [IU] via SUBCUTANEOUS
  Filled 2015-03-21: qty 0.25

## 2015-03-21 MED ORDER — SODIUM CHLORIDE 0.9 % IJ SOLN: 3.0000 mL | Freq: Two times a day (BID) | INTRAMUSCULAR | Status: DC

## 2015-03-21 MED ORDER — HEPARIN (PORCINE) IN NACL 2-0.9 UNIT/ML-% IJ SOLN
INTRAMUSCULAR | Status: AC
  Filled 2015-03-21: qty 1500

## 2015-03-21 MED ORDER — ASPIRIN 81 MG PO CHEW
81.0000 mg | CHEWABLE_TABLET | Freq: Every day | ORAL | Status: DC
  Administered 2015-03-22: 81 mg via ORAL
  Filled 2015-03-21: qty 1

## 2015-03-21 MED ORDER — ACETAMINOPHEN 325 MG PO TABS: 650.0000 mg | ORAL_TABLET | ORAL | Status: DC | PRN

## 2015-03-21 MED ORDER — SODIUM CHLORIDE 0.9 % IJ SOLN
3.0000 mL | Freq: Two times a day (BID) | INTRAMUSCULAR | Status: DC
  Administered 2015-03-21 – 2015-03-22 (×2): 3 mL via INTRAVENOUS

## 2015-03-21 MED ORDER — LIDOCAINE HCL (PF) 1 % IJ SOLN
INTRAMUSCULAR | Status: AC
  Filled 2015-03-21: qty 30

## 2015-03-21 MED ORDER — IOHEXOL 350 MG/ML SOLN
INTRAVENOUS | Status: DC | PRN
  Administered 2015-03-21: 100 mL via INTRA_ARTERIAL

## 2015-03-21 MED ORDER — POTASSIUM CHLORIDE CRYS ER 20 MEQ PO TBCR
40.0000 meq | EXTENDED_RELEASE_TABLET | Freq: Once | ORAL | Status: AC
  Administered 2015-03-21: 40 meq via ORAL
  Filled 2015-03-21: qty 2

## 2015-03-21 MED ORDER — HEPARIN SODIUM (PORCINE) 1000 UNIT/ML IJ SOLN
INTRAMUSCULAR | Status: DC | PRN
  Administered 2015-03-21: 6000 [IU] via INTRAVENOUS

## 2015-03-21 MED ORDER — SODIUM CHLORIDE 0.9 % WEIGHT BASED INFUSION
3.0000 mL/kg/h | INTRAVENOUS | Status: AC
  Administered 2015-03-21 (×2): 3 mL/kg/h via INTRAVENOUS

## 2015-03-21 MED ORDER — VERAPAMIL HCL 2.5 MG/ML IV SOLN
INTRAVENOUS | Status: AC
  Filled 2015-03-21: qty 2

## 2015-03-21 MED ORDER — NITROGLYCERIN 1 MG/10 ML FOR IR/CATH LAB
INTRA_ARTERIAL | Status: AC
  Filled 2015-03-21: qty 10

## 2015-03-21 MED ORDER — SODIUM CHLORIDE 0.9 % IV SOLN: INTRAVENOUS | Status: DC

## 2015-03-21 MED ORDER — ZOLPIDEM TARTRATE 5 MG PO TABS
5.0000 mg | ORAL_TABLET | Freq: Every evening | ORAL | Status: DC | PRN
  Administered 2015-03-21: 5 mg via ORAL
  Filled 2015-03-21: qty 1

## 2015-03-21 MED ORDER — VERAPAMIL HCL 2.5 MG/ML IV SOLN
INTRAVENOUS | Status: DC | PRN
  Administered 2015-03-21: 11:00:00 via INTRA_ARTERIAL

## 2015-03-21 MED ORDER — MIDAZOLAM HCL 2 MG/2ML IJ SOLN
INTRAMUSCULAR | Status: DC | PRN
  Administered 2015-03-21 (×3): 1 mg via INTRAVENOUS

## 2015-03-21 SURGICAL SUPPLY — 12 items
CATH INFINITI 5 FR JL3.5 (CATHETERS) ×2 IMPLANT
CATH INFINITI JR4 5F (CATHETERS) ×2 IMPLANT
CATH SITESEER 5F MULTI A 2 (CATHETERS) IMPLANT
DEVICE RAD COMP TR BAND LRG (VASCULAR PRODUCTS) ×2 IMPLANT
GLIDESHEATH SLEND A-KIT 6F 22G (SHEATH) ×2 IMPLANT
KIT HEART LEFT (KITS) ×2 IMPLANT
PACK CARDIAC CATHETERIZATION (CUSTOM PROCEDURE TRAY) ×2 IMPLANT
SHEATH PINNACLE 5F 10CM (SHEATH) IMPLANT
TRANSDUCER W/STOPCOCK (MISCELLANEOUS) ×2 IMPLANT
TUBING CIL FLEX 10 FLL-RA (TUBING) ×2 IMPLANT
WIRE EMERALD 3MM-J .035X150CM (WIRE) IMPLANT
WIRE SAFE-T 1.5MM-J .035X260CM (WIRE) ×2 IMPLANT

## 2015-03-21 NOTE — Interval H&P Note (Signed)
Cath Lab Visit (complete for each Cath Lab visit)  Clinical Evaluation Leading to the Procedure:   ACS: Yes.    Non-ACS:    Anginal Classification: CCS III  Anti-ischemic medical therapy: Maximal Therapy (2 or more classes of medications)  Non-Invasive Test Results: No non-invasive testing performed  Prior CABG: No previous CABG      History and Physical Interval Note:  03/21/2015 10:28 AM  Jake Kirschneravid Witczak  has presented today for surgery, with the diagnosis of cp  The various methods of treatment have been discussed with the patient and family. After consideration of risks, benefits and other options for treatment, the patient has consented to  Procedure(s): Left Heart Cath and Coronary Angiography (N/A) as a surgical intervention .  The patient's history has been reviewed, patient examined, no change in status, stable for surgery.  I have reviewed the patient's chart and labs.  Questions were answered to the patient's satisfaction.     Lesleigh NoeSMITH III,HENRY W

## 2015-03-21 NOTE — Progress Notes (Signed)
ANTICOAGULATION CONSULT NOTE - Follow up Pharmacy Consult for heparin  Indication: chest pain/ACS  Allergies  Allergen Reactions  . Shellfish Allergy Swelling    Patient Measurements: Height: 5\' 11"  (180.3 cm) Weight: 270 lb 6.4 oz (122.653 kg) IBW/kg (Calculated) : 75.3 Heparin Dosing Weight: 100kg  Vital Signs: Temp: 97.5 F (36.4 C) (05/23 0719) Temp Source: Oral (05/23 0719) BP: 170/98 mmHg (05/23 0900) Pulse Rate: 91 (05/23 0900)  Labs:  Recent Labs  03/20/15 1658 03/20/15 2315 03/21/15 0030 03/21/15 0448 03/21/15 0815  HGB 14.5  --   --  13.6  --   HCT 42.8  --   --  39.2  --   PLT 243  --   --  210  --   HEPARINUNFRC  --   --  0.10*  --  0.29*  CREATININE 0.84  --   --  0.80  --   TROPONINI  --  0.28*  --  0.23*  --     Estimated Creatinine Clearance: 147.3 mL/min (by C-G formula based on Cr of 0.8).   Medical History: Past Medical History  Diagnosis Date  . Coronary artery disease   . Hypertension   . Diabetes mellitus without complication   . Obesity (BMI 30-39.9)   . Hyperlipidemia      Assessment: Heparin level = 0.29 on IV heparin rate 1700 units/hr for chest pian/ACS in this 49 y.o male.  h/o CAD (including stents) presents with chest pain.  Troponin elevated. No bleeding noted. H/H and Plt wnl. Patient is now going to cath lab. Heparin infusion has been turned off per RN.   Goal of Therapy:  Heparin level 0.3-0.7 units/ml Monitor platelets by anticoagulation protocol: Yes   Plan:  -F/u after cath.   Vinnie LevelBenjamin Cortlan Dolin, PharmD., BCPS Clinical Pharmacist Pager [phone]

## 2015-03-21 NOTE — Progress Notes (Signed)
  Echocardiogram 2D Echocardiogram has been performed.  Arvil ChacoFoster, Kermitt Harjo 03/21/2015, 12:25 PM

## 2015-03-21 NOTE — Progress Notes (Addendum)
Patient has been having significant sacral pain after recent fall prior to admission. He does have some bruise around the area. He complain of lower back pain. However he states he would not tolerate CT given severe claustrophobia. We will manage with pain medication for now. However if lower back pain, continue to worsen, may need benzo and CT scan.   Will add CBC to tomorrow's lab  Signed, Azalee CourseHao Tu Bayle PA Pager: [phone]

## 2015-03-21 NOTE — Progress Notes (Signed)
Utilization review complete. Tiphany Fayson RN CCM Case Mgmt phone [phone] 

## 2015-03-21 NOTE — Progress Notes (Signed)
Patient Name: Jake Mayo Date of Encounter: 03/21/2015   SUBJECTIVE  Pt complaining of palpitation and chest pain. Pt did not slept last night due to palpitation. Denies SOB. Current chest pain 8/10. Describes it as squishing, however not similar to previous cardiac pain. Fell 4 weeks ago and stated that he had negative workup at ER in [place]. 10/10 back/ Buttock pain. Primary cardiologist Dr. Greer Pickerel @ [facility], [place], [place].   CURRENT MEDS . aspirin EC  81 mg Oral Daily  . atorvastatin  40 mg Oral QPM  . clopidogrel  75 mg Oral Q breakfast  . FLUoxetine  20 mg Oral q morning - 10a  . insulin aspart  0-15 Units Subcutaneous TID WC  . insulin detemir  50 Units Subcutaneous BID  . isosorbide mononitrate  60 mg Oral q morning - 10a  . lisinopril  40 mg Oral q morning - 10a  . metoprolol succinate  50 mg Oral q morning - 10a  . pantoprazole  40 mg Oral Q1200  . spironolactone  25 mg Oral Daily    OBJECTIVE  Filed Vitals:   03/21/15 0140 03/21/15 0400 03/21/15 0500 03/21/15 0719  BP: 156/91 133/79 141/86 155/98  Pulse: 104  95 88  Temp:   98.7 F (37.1 C) 97.5 F (36.4 C)  TempSrc:   Oral Oral  Resp: Height:      Weight:      SpO2: 98%  97% 97%    Intake/Output Summary (Last 24 hours) at 03/21/15 0743 Last data filed at 03/21/15 0300  Gross per 24 hour  Intake 425.29 ml  Output      0 ml  Net 425.29 ml   Filed Weights   03/20/15 1739 03/20/15 2100  Weight: 250 lb (113.399 kg) 270 lb 6.4 oz (122.653 kg)    PHYSICAL EXAM  General: Pleasant, Obese male, appears to be in pain.  Neuro: Alert and oriented X 3. Moves all extremities spontaneously. Psych: Normal affect. HEENT:  Normal  Neck: Supple without bruits or JVD. Lungs:  Resp regular and unlabored, CTA. Heart: regular rhythm with tachycardia.  no s3, s4, or murmurs. Abdomen: Soft, non-tender, non-distended, BS + x 4.  Extremities: No clubbing, cyanosis or edema. DP/PT/Radials 2+ and  equal bilaterally. Back: Tender lower back. Bruise Left buttock.  Accessory Clinical Findings  CBC  Recent Labs  03/20/15 1658 03/21/15 0448  WBC 6.6 4.3  HGB 14.5 13.6  HCT 42.8 39.2  MCV 94.1 92.7  PLT 243 210   Basic Metabolic Panel  Recent Labs  03/20/15 1658 03/21/15 0448  NA 136 137  K 3.8 3.3*  CL 100* 100*  CO2 20* 25  GLUCOSE 188* 154*  BUN 12 9  CREATININE 0.84 0.80  CALCIUM 9.4 8.6*   Liver Function Tests  Cardiac Enzymes  Recent Labs  03/20/15 2315 03/21/15 0448  TROPONINI 0.28* 0.23*     Recent Labs  03/21/15 0448  CHOL 141  HDL 42  LDLCALC 21  TRIG 388*  CHOLHDL 3.4   Thyroid Function Tests  Recent Labs  03/20/15 2315  TSH 1.375    TELE  Normal sinus rhythm with tachycardia and occasional PVCs.  Radiology/Studies  Dg Chest 2 View  03/20/2015   CLINICAL DATA:  Chest pain, weakness, palpitations.  Hypertension.  EXAM: CHEST  2 VIEW  COMPARISON:  01/03/2006; chest CT - 01/03/2006  FINDINGS: Grossly unchanged enlarged cardiac silhouette and mediastinal contours. No focal airspace opacities. No  pleural effusion or pneumothorax. No evidence of edema. There is persistent mild elevation of the bilateral hemidiaphragms. No pleural effusion or pneumothorax. No acute osseous abnormalities. Stigmata of dish within the caudal aspect of the thoracic spine.  IMPRESSION: Cardiomegaly without acute cardiopulmonary disease.   Electronically Signed   By: Simonne ComeJohn  Watts M.D.   On: 03/20/2015 18:24    ASSESSMENT AND PLAN Active Problems:   Non-STEMI (non-ST elevated myocardial infarction)    1. NSTEMI - Trop trend  0.23-->0.28-->0.23. Complaining of squishing chest. No nitro drip. Recent cath 3 weeks ago.  - Pt is NPO. His pain is concerning. Will proceed with cath. Increase nitro gtt and morphine.  Tele showed normal sinus rhythm with tachycardia and occasional PVCs. EKG today TWI in lead V4 to V6, LBBB? - TSH normal. - Continue ASA, heparin,  lipitor, plavix, imdur, lisinopril, spironolactone.   2. CAD s/p multiple stents, most recent 3 weeks ago - As above.  3. HTN - BP 155/98. Increase nitro gtt. Continue meds as above.   4. DM - continue insulin.   5. Hypokalemia - K 3.3 this AM. Supplement given.   6. L buttock bruise - Fell 4 days ago, had negative workup. Continue to monitor.    Signed, Manson PasseyBhagat,Bhavinkumar PA-C   Attending Note:   The patient was seen and examined.  Agree with assessment and plan as noted above.  Changes made to the above note as needed.  Pt has now returned from the cath lab Has no obstructive CAD.  Stents are patent. Will observe overnight and anticipate D C tomorrow.  Given low EF , will place an event monitor on him to look for arrhythmias.   Vesta MixerPhilip J. Dynasty Holquin, Montez HagemanJr., MD, [facility] 03/21/2015, 12:04 PM [address],  [address] Office [phone] Pager [phone]- [phone]

## 2015-03-21 NOTE — Progress Notes (Signed)
ANTICOAGULATION CONSULT NOTE - Follow up Pharmacy Consult for heparin  Indication: chest pain/ACS  Allergies  Allergen Reactions  . Shellfish Allergy Swelling    Patient Measurements: Height: 5\' 11"  (180.3 cm) Weight: 270 lb 6.4 oz (122.653 kg) IBW/kg (Calculated) : 75.3 Heparin Dosing Weight: 100kg  Vital Signs: Temp: 98.6 F (37 C) (05/22 2100) Temp Source: Oral (05/22 2100) BP: 152/88 mmHg (05/22 2100) Pulse Rate: 109 (05/22 2100)  Labs:  Recent Labs  03/20/15 1658 03/20/15 2315 03/21/15 0030  HGB 14.5  --   --   HCT 42.8  --   --   PLT 243  --   --   HEPARINUNFRC  --   --  0.10*  CREATININE 0.84  --   --   TROPONINI  --  0.28*  --     Estimated Creatinine Clearance: 140.3 mL/min (by C-G formula based on Cr of 0.84).   Medical History: Past Medical History  Diagnosis Date  . Coronary artery disease   . Hypertension   . Diabetes mellitus without complication   . Obesity (BMI 30-39.9)   . Hyperlipidemia      Assessment: Heparin level = 0.1 on IV heparin rate 1400 units/hr for chest pian/ACS in this 49 y.o male.  h/o CAD (including stents) presents with chest pain.  Troponin elevated. No bleeding noted.  RN reports heparin off ~1215min due to small infiltration. New site obtained and heparin infusing appropriately.   Goal of Therapy:  Heparin level 0.3-0.7 units/ml Monitor platelets by anticoagulation protocol: Yes   Plan:   Heparin 3000 unit bolus and increase drip rate to 1700 units/hr  Check 6h heparin level   Daily heparin level and CBC  Monitor for bleeding  Noah Delaineuth Katrin Grabel, RPh Clinical Pharmacist Pager: [phone]  03/21/2015,1:17 AM

## 2015-03-22 ENCOUNTER — Encounter (HOSPITAL_COMMUNITY): Payer: Self-pay | Admitting: Emergency Medicine

## 2015-03-22 ENCOUNTER — Emergency Department (HOSPITAL_COMMUNITY)
Admission: EM | Admit: 2015-03-22 | Discharge: 2015-03-23 | Disposition: A | Discharge status: Home / Self Care | Payer: No Typology Code available for payment source | Attending: Emergency Medicine | Admitting: Emergency Medicine

## 2015-03-22 DIAGNOSIS — Z79899 Other long term (current) drug therapy: Secondary | ICD-10-CM | POA: Insufficient documentation

## 2015-03-22 DIAGNOSIS — Z7982 Long term (current) use of aspirin: Secondary | ICD-10-CM | POA: Insufficient documentation

## 2015-03-22 DIAGNOSIS — I10 Essential (primary) hypertension: Secondary | ICD-10-CM | POA: Insufficient documentation

## 2015-03-22 DIAGNOSIS — Z7902 Long term (current) use of antithrombotics/antiplatelets: Secondary | ICD-10-CM | POA: Insufficient documentation

## 2015-03-22 DIAGNOSIS — R61 Generalized hyperhidrosis: Secondary | ICD-10-CM | POA: Insufficient documentation

## 2015-03-22 DIAGNOSIS — I5022 Chronic systolic (congestive) heart failure: Secondary | ICD-10-CM

## 2015-03-22 DIAGNOSIS — E119 Type 2 diabetes mellitus without complications: Secondary | ICD-10-CM | POA: Insufficient documentation

## 2015-03-22 DIAGNOSIS — I255 Ischemic cardiomyopathy: Secondary | ICD-10-CM | POA: Diagnosis present

## 2015-03-22 DIAGNOSIS — I447 Left bundle-branch block, unspecified: Secondary | ICD-10-CM | POA: Diagnosis present

## 2015-03-22 DIAGNOSIS — M549 Dorsalgia, unspecified: Secondary | ICD-10-CM | POA: Insufficient documentation

## 2015-03-22 DIAGNOSIS — Z794 Long term (current) use of insulin: Secondary | ICD-10-CM | POA: Insufficient documentation

## 2015-03-22 DIAGNOSIS — Z9889 Other specified postprocedural states: Secondary | ICD-10-CM | POA: Insufficient documentation

## 2015-03-22 DIAGNOSIS — Z87891 Personal history of nicotine dependence: Secondary | ICD-10-CM | POA: Insufficient documentation

## 2015-03-22 DIAGNOSIS — E785 Hyperlipidemia, unspecified: Secondary | ICD-10-CM | POA: Insufficient documentation

## 2015-03-22 DIAGNOSIS — I214 Non-ST elevation (NSTEMI) myocardial infarction: Principal | ICD-10-CM

## 2015-03-22 DIAGNOSIS — E669 Obesity, unspecified: Secondary | ICD-10-CM | POA: Insufficient documentation

## 2015-03-22 DIAGNOSIS — Z9861 Coronary angioplasty status: Secondary | ICD-10-CM | POA: Insufficient documentation

## 2015-03-22 DIAGNOSIS — R0789 Other chest pain: Secondary | ICD-10-CM | POA: Diagnosis not present

## 2015-03-22 DIAGNOSIS — I2510 Atherosclerotic heart disease of native coronary artery without angina pectoris: Secondary | ICD-10-CM | POA: Insufficient documentation

## 2015-03-22 LAB — CBC
HCT: 37.9 % — ABNORMAL LOW (ref 39.0–52.0)
HEMOGLOBIN: 12.9 g/dL — AB (ref 13.0–17.0)
MCH: 31.9 pg (ref 26.0–34.0)
MCHC: 34 g/dL (ref 30.0–36.0)
MCV: 93.6 fL (ref 78.0–100.0)
Platelets: 190 10*3/uL (ref 150–400)
RBC: 4.05 MIL/uL — ABNORMAL LOW (ref 4.22–5.81)
RDW: 14.6 % (ref 11.5–15.5)
WBC: 3.4 10*3/uL — ABNORMAL LOW (ref 4.0–10.5)

## 2015-03-22 LAB — BASIC METABOLIC PANEL
Anion gap: 11 (ref 5–15)
BUN: 11 mg/dL (ref 6–20)
CO2: 22 mmol/L (ref 22–32)
CREATININE: 0.64 mg/dL (ref 0.61–1.24)
Calcium: 8.4 mg/dL — ABNORMAL LOW (ref 8.9–10.3)
Chloride: 102 mmol/L (ref 101–111)
GFR calc Af Amer: >60 mL/min (ref >60)
GFR calc non Af Amer: >60 mL/min (ref >60)
Glucose, Bld: 148 mg/dL — ABNORMAL HIGH (ref 65–99)
Potassium: 3.7 mmol/L (ref 3.5–5.1)
SODIUM: 135 mmol/L (ref 135–145)

## 2015-03-22 LAB — CBC WITH DIFFERENTIAL/PLATELET
BASOS PCT: 1 % (ref 0–1)
Basophils Absolute: 0 10*3/uL (ref 0.0–0.1)
EOS ABS: 0.1 10*3/uL (ref 0.0–0.7)
Eosinophils Relative: 2 % (ref 0–5)
HEMATOCRIT: 39.8 % (ref 39.0–52.0)
Hemoglobin: 14.1 g/dL (ref 13.0–17.0)
LYMPHS ABS: 1.1 10*3/uL (ref 0.7–4.0)
LYMPHS PCT: 20 % (ref 12–46)
MCH: 32.8 pg (ref 26.0–34.0)
MCHC: 35.4 g/dL (ref 30.0–36.0)
MCV: 92.6 fL (ref 78.0–100.0)
MONOS PCT: 9 % (ref 3–12)
Monocytes Absolute: 0.5 10*3/uL (ref 0.1–1.0)
NEUTROS ABS: 3.8 10*3/uL (ref 1.7–7.7)
NEUTROS PCT: 68 % (ref 43–77)
Platelets: 241 10*3/uL (ref 150–400)
RBC: 4.3 MIL/uL (ref 4.22–5.81)
RDW: 14.6 % (ref 11.5–15.5)
WBC: 5.6 10*3/uL (ref 4.0–10.5)

## 2015-03-22 LAB — I-STAT CHEM 8, ED
BUN: 14 mg/dL (ref 6–20)
CREATININE: 0.6 mg/dL — AB (ref 0.61–1.24)
Calcium, Ion: 1.22 mmol/L (ref 1.12–1.23)
Chloride: 99 mmol/L — ABNORMAL LOW (ref 101–111)
Glucose, Bld: 183 mg/dL — ABNORMAL HIGH (ref 65–99)
HEMATOCRIT: 43 % (ref 39.0–52.0)
Hemoglobin: 14.6 g/dL (ref 13.0–17.0)
Potassium: 3.9 mmol/L (ref 3.5–5.1)
Sodium: 135 mmol/L (ref 135–145)
TCO2: 21 mmol/L (ref 0–100)

## 2015-03-22 LAB — I-STAT TROPONIN, ED: TROPONIN I, POC: 0.11 ng/mL — AB (ref 0.00–0.08)

## 2015-03-22 LAB — GLUCOSE, CAPILLARY
Glucose-Capillary: 176 mg/dL — ABNORMAL HIGH (ref 65–99)
Glucose-Capillary: 171 mg/dL — ABNORMAL HIGH (ref 65–99)

## 2015-03-22 LAB — HEMOGLOBIN A1C
HEMOGLOBIN A1C: 9.3 % — AB (ref 4.8–5.6)
MEAN PLASMA GLUCOSE: 220 mg/dL

## 2015-03-22 MED ORDER — CLOPIDOGREL BISULFATE 75 MG PO TABS: 75.0000 mg | ORAL_TABLET | Freq: Every day | ORAL | Status: DC

## 2015-03-22 MED ORDER — ACETAMINOPHEN 325 MG PO TABS
650.0000 mg | ORAL_TABLET | Freq: Once | ORAL | Status: AC
  Administered 2015-03-23: 650 mg via ORAL
  Filled 2015-03-22: qty 2

## 2015-03-22 MED ORDER — ACETAMINOPHEN 325 MG PO TABS: 650.0000 mg | ORAL_TABLET | ORAL | Status: AC | PRN

## 2015-03-22 MED ORDER — NITROGLYCERIN 0.4 MG SL SUBL
0.4000 mg | SUBLINGUAL_TABLET | SUBLINGUAL | Status: DC | PRN
  Administered 2015-03-22 (×2): 0.4 mg via SUBLINGUAL
  Filled 2015-03-22: qty 1

## 2015-03-22 MED ORDER — NITROGLYCERIN 0.4 MG SL SUBL: 0.4000 mg | SUBLINGUAL_TABLET | SUBLINGUAL | Status: AC | PRN

## 2015-03-22 MED ORDER — SODIUM CHLORIDE 0.9 % IV SOLN
Freq: Once | INTRAVENOUS | Status: AC
  Administered 2015-03-22: via INTRAVENOUS

## 2015-03-22 MED ORDER — LORAZEPAM 1 MG PO TABS
1.0000 mg | ORAL_TABLET | Freq: Once | ORAL | Status: AC
  Administered 2015-03-23: 1 mg via ORAL
  Filled 2015-03-22: qty 1

## 2015-03-22 MED ORDER — METFORMIN HCL 1000 MG PO TABS: 1000.0000 mg | ORAL_TABLET | Freq: Two times a day (BID) | ORAL | Status: DC

## 2015-03-22 MED ORDER — PANTOPRAZOLE SODIUM 40 MG PO TBEC: 40.0000 mg | DELAYED_RELEASE_TABLET | Freq: Every day | ORAL | Status: DC

## 2015-03-22 MED FILL — Lidocaine HCl Local Preservative Free (PF) Inj 1%: INTRAMUSCULAR | Qty: 30 | Status: AC

## 2015-03-22 MED FILL — Heparin Sodium (Porcine) 2 Unit/ML in Sodium Chloride 0.9%: INTRAMUSCULAR | Qty: 1500 | Status: AC

## 2015-03-22 NOTE — ED Provider Notes (Signed)
CSN: [id number]     Arrival date & time 03/22/15  2223 History   First MD Initiated Contact with Patient 03/22/15 2252     Chief Complaint  Patient presents with  . Hypertension     (Consider location/radiation/quality/duration/timing/severity/associated sxs/prior Treatment) HPI Comments: This is a 49 year old gentleman with significant cardiac history who was discharged from the hospital today after having a cardiac catheterization.  He states he was exercising outside i.e., walking about 50 feet into his ambulation.  He noticed bilateral chest pain, diaphoresis and a choking sensation.  He took an aspirin additional lisinopril.  An additional Metroprolol without resolution of his symptoms.  He was transported by EMS.  Patient is a 49 y.o. male presenting with hypertension. The history is provided by the patient.  Hypertension This is a recurrent problem. The current episode started today. The problem occurs constantly. The problem has been unchanged. Associated symptoms include chest pain and diaphoresis. Pertinent negatives include no coughing, headaches, nausea, numbness or weakness. The symptoms are aggravated by exertion. Treatments tried: medications  The treatment provided no relief.    Past Medical History  Diagnosis Date  . Coronary artery disease   . Hypertension   . Diabetes mellitus without complication   . Obesity (BMI 30-39.9)   . Hyperlipidemia    Past Surgical History  Procedure Laterality Date  . Coronary angioplasty with stent placement    . Laparoscopic cholecystectomy    . Cardiac catheterization N/A 03/21/2015    Procedure: Left Heart Cath and Coronary Angiography;  Surgeon: Lyn RecordsHenry W Smith, MD;  [facility];  Service: Cardiovascular;  Laterality: N/A;   History reviewed. No pertinent family history. History  Substance Use Topics  . Smoking status: Former Smoker -- 1.00 packs/day for 30 years    Types: Cigarettes  . Smokeless tobacco: Never  Used  . Alcohol Use: No    Review of Systems  Constitutional: Positive for diaphoresis.  HENT: Negative for trouble swallowing.   Respiratory: Negative for cough.   Cardiovascular: Positive for chest pain.  Gastrointestinal: Negative for nausea.  Neurological: Negative for dizziness, weakness, numbness and headaches.  All other systems reviewed and are negative.     Allergies  Shellfish allergy  Home Medications   Prior to Admission medications   Medication Sig Start Date End Date Taking? Authorizing Provider  acetaminophen (TYLENOL) 325 MG tablet Take 2 tablets (650 mg total) by mouth every 4 (four) hours as needed for headache or mild pain. 03/22/15  Yes Abelino DerrickLuke K Kilroy, PA-C  aspirin EC 81 MG tablet Take 81 mg by mouth every morning.   Yes Historical Provider, MD  atorvastatin (LIPITOR) 40 MG tablet Take 40 mg by mouth every evening.   Yes Historical Provider, MD  clopidogrel (PLAVIX) 75 MG tablet Take 1 tablet (75 mg total) by mouth daily with breakfast. 03/22/15  Yes Eda PaschalLuke K Kilroy, PA-C  eplerenone (INSPRA) 50 MG tablet Take 50 mg by mouth every evening.   Yes Historical Provider, MD  FLUoxetine (PROZAC) 20 MG capsule Take 20 mg by mouth every morning.   Yes Historical Provider, MD  insulin detemir (LEVEMIR) 100 UNIT/ML injection Inject 50 Units into the skin 2 (two) times daily.   Yes Historical Provider, MD  isosorbide mononitrate (IMDUR) 60 MG 24 hr tablet Take 60 mg by mouth every morning.   Yes Historical Provider, MD  lisinopril (PRINIVIL,ZESTRIL) 40 MG tablet Take 40 mg by mouth every morning.   Yes Historical Provider, MD  metFORMIN (  GLUCOPHAGE) 1000 MG tablet Take 1 tablet (1,000 mg total) by mouth 2 (two) times daily with a meal. 03/25/15  Yes Eda Paschal Kilroy, PA-C  metoprolol succinate (TOPROL-XL) 50 MG 24 hr tablet Take 50 mg by mouth daily.  02/15/15  Yes Historical Provider, MD  nitroGLYCERIN (NITROSTAT) 0.4 MG SL tablet Place 1 tablet (0.4 mg total) under the tongue  every 5 (five) minutes x 3 doses as needed for chest pain. 03/22/15  Yes Luke K Kilroy, PA-C  pantoprazole (PROTONIX) 40 MG tablet Take 1 tablet (40 mg total) by mouth daily at 12 noon. 03/22/15  Yes Luke K Kilroy, PA-C   BP 144/80 mmHg  Pulse 72  Temp(Src) 98.7 F (37.1 C) (Oral)  Resp 10  Ht  (1.803 m)  Wt 245 lb (111.131 kg)  BMI 34.19 kg/m2  SpO2 96% Physical Exam  Constitutional: He is oriented to person, place, and time. He appears well-developed and well-nourished.  HENT:  Head: Normocephalic.  Eyes: Pupils are equal, round, and reactive to light.  Neck: Normal range of motion.  Cardiovascular: Normal rate.   Pulmonary/Chest: Effort normal and breath sounds normal. No respiratory distress.  Abdominal: Soft.  Musculoskeletal: Normal range of motion.  Neurological: He is alert and oriented to person, place, and time.  Skin: Skin is warm.  Nursing note and vitals reviewed.   ED Course  Procedures (including critical care time) Labs Review Labs Reviewed  I-STAT CHEM 8, ED - Abnormal; Notable for the following:    Chloride 99 (*)    Creatinine, Ser 0.60 (*)    Glucose, Bld 183 (*)    All other components within normal limits  I-STAT TROPOININ, ED - Abnormal; Notable for the following:    Troponin i, poc 0.11 (*)    All other components within normal limits  I-STAT TROPOININ, ED - Abnormal; Notable for the following:    Troponin i, poc 0.12 (*)    All other components within normal limits  CBC WITH DIFFERENTIAL/PLATELET    Imaging Review No results found.   EKG Interpretation   Date/Time:  Tuesday Mar 22 2015 22:25:43 EDT Ventricular Rate:  78 PR Interval:  155 QRS Duration: 132 QT Interval:  587 QTC Calculation: 669 R Axis:   -54 Text Interpretation:  Sinus rhythm Left bundle branch block No significant  change since last tracing Confirmed by HORTON  MD, Toni Amend ([id number]) on  03/23/2015 12:31:12 AM     Vision is currently chest pain-free, is  complaining of some mild back pain but this is due to a trauma where he fell and hit his back last week prior to admission, he did have a cardiac catheter which showed clean coronaries and patent stents and was discharged from the hospital yesterday.  He had some exertional chest pain and neck discomfort, which did not respond to sublingual nitroglycerin, but did respond to Ativan.  2 sets of cardiac markers were done in the emergency department, both essentially the same at point 11 and 0.12.  This is trending downward from the initial troponin that he had on admission of 0.23.  I discussed this with the cardiologist.  He states that the troponin would take several days to normalize and in the base of a recent cardiac catheterization, it would not be expected to be 0 at this time.  Patient is to call his cardiologist in [place], to arrange follow-up in the next 1-2 weeks. MDM   Final diagnoses:  Other chest pain  Bilateral back  pain, unspecified location         Earley Favor, NP 03/23/15 1610  Shon Baton, MD 03/23/15 [phone]

## 2015-03-22 NOTE — Evaluation (Signed)
Physical Therapy Evaluation Patient Details Name: Jake KirschnerDavid Mayo MRN: [id number] DOB: 1965-12-03 Today's Date: 03/22/2015   History of Present Illness  This 49 year old male is brought in for observation with chest pain, palpitations, and shortness of breath. The patient is visiting his [place] who [facility] from out of town. He has a complicated past history. 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 [place] [facility] [place] and states that he has 9 stents. He reports having [facility] [place] 3 weeks ago for chest pain.  Clinical Impression  Pt's main complaint was low back pain from a recent fall. Pt does present with bruising on his low back. Pt educated on log roll technique with bed mobility. Pt demonstrated Mod Independence with mobility on the unit, but did c/o back discomfort. Pt has 5/5 LE strength. Pt is currently cleared for d/c home from a mobility standpoint. Pt did express concern over wanting his back to be examined further prior to d/c. Pt is d/c from PT services.    Follow Up Recommendations No PT follow up    Equipment Recommendations  None recommended by PT    Recommendations for Other Services       Precautions / Restrictions Precautions Precautions: Back Precaution Comments: pt c/o low back pain from a recent fall. Educated on log roll technique with bed mobility. Restrictions Weight Bearing Restrictions: No      Mobility  Bed Mobility Overal bed mobility: Modified Independent             General bed mobility comments: use of bed rails, and pt instructed in log roll tchnique  Transfers Overall transfer level: Modified independent Equipment used: None             General transfer comment: slow and need for UE use due to back pain.  Ambulation/Gait Ambulation/Gait assistance: Modified independent  (Device/Increase time) Ambulation Distance (Feet): 300 Feet Assistive device: None Gait Pattern/deviations: Step-through pattern;Decreased stride length Gait velocity: decreased   General Gait Details: guarded posture due to back discomfort. Pt able to turn head, increase pace, and suddenly stop without affecting his balance.  Stairs            Wheelchair Mobility    Modified Rankin (Stroke Patients Only)       Balance Overall balance assessment: No apparent balance deficits (not formally assessed)                                           Pertinent Vitals/Pain Pain Assessment: 0-10 Pain Score: 5  Pain Location: low back Pain Descriptors / Indicators: Aching Pain Intervention(s): Limited activity within patient's tolerance    Home Living Family/patient expects to be discharged to:: Private residence Living Arrangements: Alone   Type of Home: House         Home Equipment: None      Prior Function Level of Independence: Independent               Hand Dominance        Extremity/Trunk Assessment   Upper Extremity Assessment: Defer to OT evaluation           Lower Extremity Assessment: Overall WFL for tasks assessed      Cervical / Trunk Assessment:  Normal  Communication   Communication: No difficulties  Cognition Arousal/Alertness: Awake/alert Behavior During Therapy: WFL for tasks assessed/performed Overall Cognitive Status: Within Functional Limits for tasks assessed                      General Comments General comments (skin integrity, edema, etc.): pt has some bruising on his low back.    Exercises        Assessment/Plan    PT Assessment Patent does not need any further PT services  PT Diagnosis     PT Problem List    PT Treatment Interventions     PT Goals (Current goals can be found in the Care Plan section)      Frequency     Barriers to discharge        Co-evaluation                End of Session   Activity Tolerance: Patient tolerated treatment well Patient left: in bed;with call bell/phone within reach Nurse Communication: Mobility status (pt is Indep. with gait)         Time: 1101-1134 PT Time Calculation (min) (ACUTE ONLY): 33 min   Charges:   PT Evaluation $Initial PT Evaluation Tier I: 1 Procedure PT Treatments $Gait Training: 8-22 mins   PT G Codes:        Greggory Stallion 03/22/2015, 11:35 AM

## 2015-03-22 NOTE — Discharge Summary (Signed)
Patient ID: Jake KirschnerDavid Mayo,  MRN: [id number], DOB/AGE: 1966-02-06 49 y.o.  Admit date: 03/20/2015 Discharge date: 03/22/2015  Primary Care Provider: No primary care provider on file. [facility] [place], [place]  Discharge Diagnoses Principal Problem:   Palpitations Active Problems:   CAD S/P multiple prior PCIs, cath 02/2315- all patent   Non-STEMI- Troponin 0.23   Hypertension   Diabetes mellitus without complication   ICM- EF 35-40% at cath 03/21/15   Obesity (BMI 30-39.9)   Hyperlipidemia   LBBB     Procedures: Coronary angiogram 03/21/15   Hospital Course:  49 year old male [facility]/15 with chest pain, palpitations, and shortness of breath.The patient was visiting his parents who [facility]. He has a complicated past history. 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 He reports having [facility] [place] [facility] a number of years ago. He states that he has 9 stents. He reports having had 2 stents placed [facility] [place] 3 weeks ago for chest pain. He presented with shortness of breath and states he has diabetes as well as congestive heart failure. He says that he lives alone and supports himself financially with support of his family. He says he quit smoking a year or 2 ago.His Troponin was positive and [facility]. This was done 03/21/15 by Dr Katrinka BlazingSmith and revealed patent stents in the mid and distal LAD, the OM1, and the RCA. His EF at cath was 35-40%, LVEDP 18. Plan is for continued medical Rx. He'll follow up with his cardiologist in [place]. Dr Katrinka BlazingSmith suggested he amy need an event monitor but we will defer that to his cardiologist. His beta blocker was increased at discharge.   Discharge Vitals:  Blood pressure 158/90, pulse 75, temperature 98.5 F (36.9 C), temperature source Oral, resp. rate 15, height 5\' 11"   (1.803 m), weight 270 lb 8.1 oz (122.7 kg), SpO2 98 %.    Labs: Results for orders placed or performed during the hospital encounter of 03/20/15 (from the past 24 hour(s))  Glucose, capillary     Status: Abnormal   Collection Time: 03/21/15 11:33 AM  Result Value Ref Range   Glucose-Capillary 145 (H) 65 - 99 mg/dL  Troponin I-(serum)     Status: Abnormal   Collection Time: 03/21/15  2:03 PM  Result Value Ref Range   Troponin I 0.17 (H) <0.031 ng/mL  Glucose, capillary     Status: Abnormal   Collection Time: 03/21/15  4:18 PM  Result Value Ref Range   Glucose-Capillary 150 (H) 65 - 99 mg/dL   Comment 1 Notify RN   Glucose, capillary     Status: Abnormal   Collection Time: 03/21/15  9:07 PM  Result Value Ref Range   Glucose-Capillary 151 (H) 65 - 99 mg/dL  CBC     Status: Abnormal   Collection Time: 03/22/15  3:34 AM  Result Value Ref Range   WBC 3.4 (L) 4.0 - 10.5 K/uL   RBC 4.05 (L) 4.22 - 5.81 MIL/uL   Hemoglobin 12.9 (L) 13.0 - 17.0 g/dL   HCT 40.937.9 (L) 81.139.0 - 91.452.0 %   MCV 93.6 78.0 - 100.0 fL   MCH 31.9 26.0 - 34.0 pg   MCHC 34.0 30.0 - 36.0 g/dL   RDW 78.214.6 95.611.5 - 21.315.5 %   Platelets 190 150 - 400 K/uL  Basic metabolic  panel     Status: Abnormal   Collection Time: 03/22/15  3:34 AM  Result Value Ref Range   Sodium 135 135 - 145 mmol/L   Potassium 3.7 3.5 - 5.1 mmol/L   Chloride 102 101 - 111 mmol/L   CO2 22 22 - 32 mmol/L   Glucose, Bld 148 (H) 65 - 99 mg/dL   BUN 11 6 - 20 mg/dL   Creatinine, Ser 7.25 0.61 - 1.24 mg/dL   Calcium 8.4 (L) 8.9 - 10.3 mg/dL   GFR calc non Af Amer >60 >60 mL/min   GFR calc Af Amer >60 >60 mL/min   Anion gap 11 5 - 15  Glucose, capillary     Status: Abnormal   Collection Time: 03/22/15  7:47 AM  Result Value Ref Range   Glucose-Capillary 176 (H) 65 - 99 mg/dL    Disposition:      Follow-up Information    Follow up with Dr Greer Pickerel. Call in 1 day.   Why:  arrange f/u in 1-2 weeks      Discharge Medications:    Medication  List    STOP taking these medications        metoprolol succinate 50 MG 24 hr tablet  Commonly known as:  TOPROL-XL      TAKE these medications        acetaminophen 325 MG tablet  Commonly known as:  TYLENOL  Take 2 tablets (650 mg total) by mouth every 4 (four) hours as needed for headache or mild pain.     aspirin EC 81 MG tablet  Take 81 mg by mouth every morning.     atorvastatin 40 MG tablet  Commonly known as:  LIPITOR  Take 40 mg by mouth every evening.     clopidogrel 75 MG tablet  Commonly known as:  PLAVIX  Take 1 tablet (75 mg total) by mouth daily with breakfast.     eplerenone 50 MG tablet  Commonly known as:  INSPRA  Take 50 mg by mouth every evening.     FLUoxetine 20 MG capsule  Commonly known as:  PROZAC  Take 20 mg by mouth every morning.     insulin detemir 100 UNIT/ML injection  Commonly known as:  LEVEMIR  Inject 50 Units into the skin 2 (two) times daily.     isosorbide mononitrate 60 MG 24 hr tablet  Commonly known as:  IMDUR  Take 60 mg by mouth every morning.     lisinopril 40 MG tablet  Commonly known as:  PRINIVIL,ZESTRIL  Take 40 mg by mouth every morning.     metFORMIN 1000 MG tablet  Commonly known as:  GLUCOPHAGE  Take 1 tablet (1,000 mg total) by mouth 2 (two) times daily with a meal.  Start taking on:  03/25/2015     nitroGLYCERIN 0.4 MG SL tablet  Commonly known as:  NITROSTAT  Place 1 tablet (0.4 mg total) under the tongue every 5 (five) minutes x 3 doses as needed for chest pain.     pantoprazole 40 MG tablet  Commonly known as:  PROTONIX  Take 1 tablet (40 mg total) by mouth daily at 12 noon.         Duration of Discharge Encounter: Greater than 30 minutes including physician time.  Jolene Provost PA-C 03/22/2015 10:59 AM   Attending Note:   The patient was seen and examined.  Agree with assessment and plan as noted above.  Changes made to the above note  as needed.  See my progress note from earlier today     Alvia Grove., MD, [facility] 03/22/2015, 4:40 PM [address],  [address] Office [phone] Pager [phone]

## 2015-03-22 NOTE — ED Notes (Signed)
Per EMS: Pt was d/c'd this morning after having a cath done on Monday.  Pt went outside to do some exercise and began to have an "uneasiness" in his chest, sts it did not feel like his GERD.  Pt sts he took his BP on his home monitor and it read above 200 systolic.  EMS got 190s initially.  142/96 at arrival.  Pt showing some wide QRS on EKG.  Pt sts he took Aspirin 17:30 (325mg ) and took an additional of Lisinopril and Metoprolol.  Pt alert and oriented in room at this time.

## 2015-03-22 NOTE — Progress Notes (Signed)
Patient Name: Jake KirschnerDavid Maxton Date of Encounter: 03/22/2015   SUBJECTIVE  Pt complaining of palpitation and chest pain. Pt did not slept last night due to palpitation. Denies SOB. Current chest pain 8/10. Describes it as squishing, however not similar to previous cardiac pain. Fell 4 weeks ago and stated that he had negative workup at [place] in [place]. 10/10 back/ Buttock pain. Primary cardiologist Dr. Greer [facility]kerelPatrone @ [facility], [place], [place].   Cath yesterday show patent stents. LV fxn is moderately reduced. EF 35-40%. He's already on Lisinopril, Toprol xl, Imdur. Increase Toprol to 100     CURRENT MEDS . aspirin  81 mg Oral Daily  . atorvastatin  40 mg Oral QPM  . clopidogrel  75 mg Oral Q breakfast  . enoxaparin (LOVENOX) injection  40 mg Subcutaneous Q24H  . FLUoxetine  20 mg Oral q morning - 10a  . insulin aspart  0-15 Units Subcutaneous TID WC  . insulin detemir  50 Units Subcutaneous BID  . isosorbide mononitrate  60 mg Oral q morning - 10a  . lisinopril  40 mg Oral q morning - 10a  . metoprolol succinate  50 mg Oral q morning - 10a  . pantoprazole  40 mg Oral Q1200  . sodium chloride  3 mL Intravenous Q12H  . spironolactone  25 mg Oral Daily    OBJECTIVE  Filed Vitals:   03/21/15 2000 03/22/15 0000 03/22/15 0400 03/22/15 0824  BP:  122/56 122/67 158/90  Pulse:  76 66 75  Temp: 98.5 F (36.9 C) 97.8 F (36.6 C) 97.8 F (36.6 C) 98.5 F (36.9 C)  TempSrc: Oral Oral Oral Oral  Resp: 20 18 18 15   Height:      Weight:   122.7 kg (270 lb 8.1 oz)   SpO2: 97% 98% 97% 98%    Intake/Output Summary (Last 24 hours) at 03/22/15 0932 Last data filed at 03/22/15 0913  Gross per 24 hour  Intake      0 ml  Output    400 ml  Net   -400 ml   Filed Weights   03/20/15 1739 03/20/15 2100 03/22/15 0400  Weight: 113.399 kg (250 lb) 122.653 kg (270 lb 6.4 oz) 122.7 kg (270 lb 8.1 oz)    PHYSICAL EXAM  General: Pleasant, Obese male, appears to be in pain.  Neuro: Alert  and oriented X 3. Moves all extremities spontaneously. Psych: Normal affect. HEENT:  Normal  Neck: Supple without bruits or JVD. Lungs:  Resp regular and unlabored, CTA. Heart: regular rhythm with tachycardia.  no s3, s4, or murmurs. Abdomen: Soft, non-tender, non-distended, BS + x 4.  Extremities: No clubbing, cyanosis or edema. DP/PT/Radials 2+ and equal bilaterally. Back: Tender lower back. Bruise Left buttock.  Accessory Clinical Findings  CBC  Recent Labs  03/21/15 0448 03/22/15 0334  WBC 4.3 3.4*  HGB 13.6 12.9*  HCT 39.2 37.9*  MCV 92.7 93.6  PLT 210 190   Basic Metabolic Panel  Recent Labs  03/21/15 0448 03/22/15 0334  NA 137 135  K 3.3* 3.7  CL 100* 102  CO2 25 22  GLUCOSE 154* 148*  BUN 9 11  CREATININE 0.80 0.64  CALCIUM 8.6* 8.4*   Liver Function Tests  Cardiac Enzymes  Recent Labs  03/20/15 2315 03/21/15 0448 03/21/15 1403  TROPONINI 0.28* 0.23* 0.17*     Recent Labs  03/21/15 0448  CHOL 141  HDL 42  LDLCALC 21  TRIG 388*  CHOLHDL 3.4   Thyroid Function  Tests  Recent Labs  03/20/15 2315  TSH 1.375    TELE  Normal sinus rhythm with tachycardia and occasional PVCs.  Radiology/Studies  Dg Chest 2 View  03/20/2015   CLINICAL DATA:  Chest pain, weakness, palpitations.  Hypertension.  EXAM: CHEST  2 VIEW  COMPARISON:  01/03/2006; chest CT - 01/03/2006  FINDINGS: Grossly unchanged enlarged cardiac silhouette and mediastinal contours. No focal airspace opacities. No pleural effusion or pneumothorax. No evidence of edema. There is persistent mild elevation of the bilateral hemidiaphragms. No pleural effusion or pneumothorax. No acute osseous abnormalities. Stigmata of dish within the caudal aspect of the thoracic spine.  IMPRESSION: Cardiomegaly without acute cardiopulmonary disease.   Electronically Signed   By: Simonne Come M.D.   On: 03/20/2015 18:24    ASSESSMENT AND PLAN Active Problems:   Non-STEMI (non-ST elevated myocardial  infarction)   Palpitations    1. NSTEMI - Trop trend  0.23-->0.28-->0.23.    Cath shows no significant obstructive disease. Stents are patent Dc to home today   - Continue ASA, heparin, lipitor, plavix, imdur, lisinopril, spironolactone.   2. CAD s/p multiple stents, most recent 3 weeks ago - As above.  3. HTN - BP 155/98. Increase nitro gtt. Continue meds as above.  Will increase toprol   4. DM - continue insulin.   5. Hypokalemia - K 3.3 this AM. Supplement given.  Encouraged him to eat more foods high in potassium   6. L buttock bruise - Fell 4 days ago, had negative workup. Continue to monitor.  Encouraged him to see his medical doctor .   7. Chronic systolic CHF:  Continue Toprol, lisinopril , He can consider changing to stronger ARB as OP. Will follow up with his primary cardiologist in [place] .   DC to home today    Atina Feeley, Deloris Ping, MD  03/22/2015 9:38 AM    [facility] [address] [place],  [address] [place], [place]  [address] Pager [phone] Phone: [phone]; Fax: [phone]   [facility]  [address] [place], [place]  [address] [phone]    Fax [phone]

## 2015-03-22 NOTE — Discharge Instructions (Signed)
Coronary Angiogram A coronary angiogram, also called coronary angiography, is an X-ray procedure used to look at the arteries in the heart. In this procedure, a dye (contrast dye) is injected through a long, hollow tube (catheter). The catheter is about the size of a piece of cooked spaghetti and is inserted through your groin, wrist, or arm. The dye is injected into each artery, and X-rays are then taken to show if there is a blockage in the arteries of your heart. LET [facility] PROVID[facility] KNOW ABOUT:  Any allergies you have, including allergies to shellfish or contrast dye.   All medicines you are taking, including vitamins, herbs, eye drops, creams, and over-the-counter medicines.   Previous problems you or members of your family have had with the use of anesthetics.   Any blood disorders you have.   Previous surgeries you have had.  History of kidney problems or failure.   Other medical conditions you have. RISKS AND COMPLICATIONS  Generally, a coronary angiogram is a safe procedure. However, problems can occur and include:  Allergic reaction to the dye.  Bleeding from the access site or other locations.  Kidney injury, especially in people with impaired kidney function.  Stroke (rare).  Heart attack (rare). BEFORE THE PROCEDURE   Do not eat or drink anything after midnight the night before the procedure or as directed by your health care provider.   Ask your health care provider about changing or stopping your regular medicines. This is especially important if you are taking diabetes medicines or blood thinners. PROCEDURE  You may be given a medicine to help you relax (sedative) before the procedure. This medicine is given through an intravenous (IV) access tube that is inserted into one of your veins.   The area where the catheter will be inserted will be washed and shaved. This is usually done in the groin but may be done in the fold of your arm (near your  elbow) or in the wrist.   A medicine will be given to numb the area where the catheter will be inserted (local anesthetic).   The health care provider will insert the catheter into an artery. The catheter will be guided by using a special type of X-ray (fluoroscopy) of the blood vessel being examined.   A special dye will then be injected into the catheter, and X-rays will be taken. The dye will help to show where any narrowing or blockages are located in the heart arteries.  AFTER THE PROCEDURE   If the procedure is done through the leg, you will be kept in bed lying flat for several hours. You will be instructed to not bend or cross your legs.  The insertion site will be checked frequently.   The pulse in your feet or wrist will be checked frequently.   Additional blood tests, X-rays, and an electrocardiogram may be done.  Document Released: 04/21/2003 Document Revised: 03/01/2014 Document Reviewed: 03/09/2013 [facility] Patient Information 2015 [place]. This information is not intended to replace advice given to you by your health care provider. Make sure you discuss any questions you have with your health care provider. Radial Site Care Refer to this sheet in the next few weeks. These instructions provide you with information on caring for yourself after your procedure. Your caregiver may also give you more specific instructions. Your treatment has been planned according to current medical practices, but problems sometimes occur. Call your caregiver if you have any problems or questions after your procedure.  HOME CARE INSTRUCTIONS  You may shower the day after the procedure.Remove the bandage (dressing) and gently wash the site with plain soap and water.Gently pat the site dry.  Do not apply powder or lotion to the site.  Do not submerge the affected site in water for 3 to 5 days.  Inspect the site at least twice daily.  Do not flex or bend the affected arm for 24  hours.  No lifting over 5 pounds (2.3 kg) for 5 days after your procedure.  Do not drive home if you are discharged the same day of the procedure. Have someone else drive you.  You may drive 24 hours after the procedure unless otherwise instructed by your caregiver.  Do not operate machinery or power tools for 24 hours.  A responsible adult should be with you for the first 24 hours after you arrive home. What to expect:  Any bruising will usually fade within 1 to 2 weeks.  Blood that collects in the tissue (hematoma) may be painful to the touch. It should usually decrease in size and tenderness within 1 to 2 weeks. SEEK IMMEDIATE MEDICAL CARE IF:  You have unusual pain at the radial site.  You have redness, warmth, swelling, or pain at the radial site.  You have drainage (other than a small amount of blood on the dressing).  You have chills.  You have a fever or persistent symptoms for more than 72 hours.  You have a fever and your symptoms suddenly get worse.  Your arm becomes pale, cool, tingly, or numb.  You have heavy bleeding from the site. Hold pressure on the site. Document Released: 11/17/2010 Document Revised: 01/07/2012 Document Reviewed: 11/17/2010 [facility] Patient Information 2015 [place], [place]. This information is not intended to replace advice given to you by your health care provider. Make sure you discuss any questions you have with your health care provider.

## 2015-03-23 LAB — I-STAT TROPONIN, ED: TROPONIN I, POC: 0.12 ng/mL — AB (ref 0.00–0.08)

## 2015-03-23 NOTE — Discharge Instructions (Signed)
While in the emergency room tonight.  You've had 2 sets of cardiac markers, both essentially with the same results, which is trending down from your original troponin.  2 days ago.  On admission to the hospital.  This has been discussed with her cardiologist who reviewed your lab values from tonight as well as your cardiac catheter results from 2 days ago. Please call your cardiologist in [place], Dr. Elveria RisingPetrone to make arrangements for follow-up Make sure you take your medications as prescribed Return for any recurrent chest pain, diaphoresis, nausea, shortness of breath

## 2015-03-23 NOTE — ED Notes (Signed)
Pt. Left with all belongings and refused wheelchair 

## 2015-03-24 ENCOUNTER — Emergency Department (HOSPITAL_COMMUNITY)
Admission: EM | Admit: 2015-03-24 | Discharge: 2015-03-25 | Disposition: A | Discharge status: Home / Self Care | Payer: No Typology Code available for payment source | Attending: Emergency Medicine | Admitting: Emergency Medicine

## 2015-03-24 DIAGNOSIS — Z79899 Other long term (current) drug therapy: Secondary | ICD-10-CM | POA: Insufficient documentation

## 2015-03-24 DIAGNOSIS — I2510 Atherosclerotic heart disease of native coronary artery without angina pectoris: Secondary | ICD-10-CM | POA: Diagnosis not present

## 2015-03-24 DIAGNOSIS — E785 Hyperlipidemia, unspecified: Secondary | ICD-10-CM | POA: Insufficient documentation

## 2015-03-24 DIAGNOSIS — W07XXXA Fall from chair, initial encounter: Secondary | ICD-10-CM | POA: Diagnosis not present

## 2015-03-24 DIAGNOSIS — F419 Anxiety disorder, unspecified: Secondary | ICD-10-CM | POA: Diagnosis not present

## 2015-03-24 DIAGNOSIS — E669 Obesity, unspecified: Secondary | ICD-10-CM | POA: Insufficient documentation

## 2015-03-24 DIAGNOSIS — Z9861 Coronary angioplasty status: Secondary | ICD-10-CM | POA: Diagnosis not present

## 2015-03-24 DIAGNOSIS — Y998 Other external cause status: Secondary | ICD-10-CM | POA: Insufficient documentation

## 2015-03-24 DIAGNOSIS — Z7982 Long term (current) use of aspirin: Secondary | ICD-10-CM | POA: Insufficient documentation

## 2015-03-24 DIAGNOSIS — Z9889 Other specified postprocedural states: Secondary | ICD-10-CM | POA: Diagnosis not present

## 2015-03-24 DIAGNOSIS — Z87891 Personal history of nicotine dependence: Secondary | ICD-10-CM | POA: Diagnosis not present

## 2015-03-24 DIAGNOSIS — Y9289 Other specified places as the place of occurrence of the external cause: Secondary | ICD-10-CM | POA: Insufficient documentation

## 2015-03-24 DIAGNOSIS — F411 Generalized anxiety disorder: Secondary | ICD-10-CM | POA: Diagnosis not present

## 2015-03-24 DIAGNOSIS — Z7902 Long term (current) use of antithrombotics/antiplatelets: Secondary | ICD-10-CM | POA: Insufficient documentation

## 2015-03-24 DIAGNOSIS — I10 Essential (primary) hypertension: Secondary | ICD-10-CM | POA: Diagnosis not present

## 2015-03-24 DIAGNOSIS — Y9389 Activity, other specified: Secondary | ICD-10-CM | POA: Insufficient documentation

## 2015-03-24 DIAGNOSIS — F329 Major depressive disorder, single episode, unspecified: Secondary | ICD-10-CM | POA: Insufficient documentation

## 2015-03-24 DIAGNOSIS — R45851 Suicidal ideations: Secondary | ICD-10-CM | POA: Diagnosis present

## 2015-03-24 DIAGNOSIS — Z794 Long term (current) use of insulin: Secondary | ICD-10-CM | POA: Diagnosis not present

## 2015-03-24 DIAGNOSIS — S3992XA Unspecified injury of lower back, initial encounter: Secondary | ICD-10-CM | POA: Insufficient documentation

## 2015-03-24 DIAGNOSIS — E119 Type 2 diabetes mellitus without complications: Secondary | ICD-10-CM | POA: Insufficient documentation

## 2015-03-24 LAB — CBC
HCT: 44.5 % (ref 39.0–52.0)
Hemoglobin: 15.7 g/dL (ref 13.0–17.0)
MCH: 33.2 pg (ref 26.0–34.0)
MCHC: 35.3 g/dL (ref 30.0–36.0)
MCV: 94.1 fL (ref 78.0–100.0)
PLATELETS: 325 10*3/uL (ref 150–400)
RBC: 4.73 MIL/uL (ref 4.22–5.81)
RDW: 14.8 % (ref 11.5–15.5)
WBC: 7.8 10*3/uL (ref 4.0–10.5)

## 2015-03-24 LAB — COMPREHENSIVE METABOLIC PANEL
ALK PHOS: 54 U/L (ref 38–126)
ALT: 41 U/L (ref 17–63)
AST: 30 U/L (ref 15–41)
Albumin: 4.4 g/dL (ref 3.5–5.0)
Anion gap: 14 (ref 5–15)
BILIRUBIN TOTAL: 1 mg/dL (ref 0.3–1.2)
BUN: 19 mg/dL (ref 6–20)
CALCIUM: 9.7 mg/dL (ref 8.9–10.3)
CO2: 19 mmol/L — AB (ref 22–32)
Chloride: 104 mmol/L (ref 101–111)
Creatinine, Ser: 0.75 mg/dL (ref 0.61–1.24)
GFR calc Af Amer: >60 mL/min (ref >60)
GFR calc non Af Amer: >60 mL/min (ref >60)
Glucose, Bld: 195 mg/dL — ABNORMAL HIGH (ref 65–99)
Potassium: 3.9 mmol/L (ref 3.5–5.1)
SODIUM: 137 mmol/L (ref 135–145)
Total Protein: 8 g/dL (ref 6.5–8.1)

## 2015-03-24 LAB — CBG MONITORING, ED
GLUCOSE-CAPILLARY: 201 mg/dL — AB (ref 65–99)
Glucose-Capillary: 188 mg/dL — ABNORMAL HIGH (ref 65–99)

## 2015-03-24 LAB — RAPID URINE DRUG SCREEN, HOSP PERFORMED
Amphetamines: NOT DETECTED
Barbiturates: NOT DETECTED
Benzodiazepines: POSITIVE — AB
COCAINE: NOT DETECTED
Opiates: NOT DETECTED
Tetrahydrocannabinol: NOT DETECTED

## 2015-03-24 LAB — ACETAMINOPHEN LEVEL: Acetaminophen (Tylenol), Serum: <10 ug/mL — ABNORMAL LOW (ref 10–30)

## 2015-03-24 LAB — ETHANOL

## 2015-03-24 LAB — SALICYLATE LEVEL

## 2015-03-24 MED ORDER — INSULIN DETEMIR 100 UNIT/ML ~~LOC~~ SOLN
50.0000 [IU] | Freq: Two times a day (BID) | SUBCUTANEOUS | Status: DC
  Administered 2015-03-24 – 2015-03-25 (×2): 50 [IU] via SUBCUTANEOUS
  Filled 2015-03-24 (×4): qty 0.5

## 2015-03-24 MED ORDER — ACETAMINOPHEN 325 MG PO TABS
650.0000 mg | ORAL_TABLET | ORAL | Status: DC | PRN
  Administered 2015-03-24: 650 mg via ORAL
  Filled 2015-03-24: qty 2

## 2015-03-24 MED ORDER — ALUM & MAG HYDROXIDE-SIMETH 200-200-20 MG/5ML PO SUSP: 30.0000 mL | ORAL | Status: DC | PRN

## 2015-03-24 MED ORDER — METOPROLOL SUCCINATE ER 50 MG PO TB24
50.0000 mg | ORAL_TABLET | Freq: Every day | ORAL | Status: DC
  Administered 2015-03-25: 50 mg via ORAL
  Filled 2015-03-24: qty 1

## 2015-03-24 MED ORDER — LORAZEPAM 1 MG PO TABS
1.0000 mg | ORAL_TABLET | Freq: Three times a day (TID) | ORAL | Status: DC | PRN
  Administered 2015-03-24 – 2015-03-25 (×2): 1 mg via ORAL
  Filled 2015-03-24 (×3): qty 1

## 2015-03-24 MED ORDER — ASPIRIN EC 81 MG PO TBEC
81.0000 mg | DELAYED_RELEASE_TABLET | Freq: Every morning | ORAL | Status: DC
  Administered 2015-03-25: 81 mg via ORAL
  Filled 2015-03-24: qty 1

## 2015-03-24 MED ORDER — ONDANSETRON HCL 4 MG PO TABS: 4.0000 mg | ORAL_TABLET | Freq: Three times a day (TID) | ORAL | Status: DC | PRN

## 2015-03-24 MED ORDER — NICOTINE 21 MG/24HR TD PT24
21.0000 mg | MEDICATED_PATCH | Freq: Every day | TRANSDERMAL | Status: DC
Start: 2015-03-24 — End: 2015-03-25
  Administered 2015-03-24 – 2015-03-25 (×2): 21 mg via TRANSDERMAL
  Filled 2015-03-24 (×2): qty 1

## 2015-03-24 MED ORDER — NITROGLYCERIN 0.4 MG SL SUBL: 0.4000 mg | SUBLINGUAL_TABLET | SUBLINGUAL | Status: DC | PRN

## 2015-03-24 MED ORDER — FLUOXETINE HCL 20 MG PO CAPS
20.0000 mg | ORAL_CAPSULE | Freq: Every morning | ORAL | Status: DC
  Administered 2015-03-25: 20 mg via ORAL
  Filled 2015-03-24: qty 1

## 2015-03-24 MED ORDER — LORAZEPAM 1 MG PO TABS
1.0000 mg | ORAL_TABLET | Freq: Once | ORAL | Status: AC
  Administered 2015-03-24: 1 mg via ORAL

## 2015-03-24 MED ORDER — ATORVASTATIN CALCIUM 40 MG PO TABS
40.0000 mg | ORAL_TABLET | Freq: Every evening | ORAL | Status: DC
  Administered 2015-03-24: 40 mg via ORAL
  Filled 2015-03-24 (×2): qty 1

## 2015-03-24 MED ORDER — INSULIN ASPART 100 UNIT/ML ~~LOC~~ SOLN
5.0000 [IU] | Freq: Three times a day (TID) | SUBCUTANEOUS | Status: DC
  Administered 2015-03-24 – 2015-03-25 (×3): 5 [IU] via SUBCUTANEOUS
  Filled 2015-03-24 (×3): qty 1

## 2015-03-24 MED ORDER — LISINOPRIL 40 MG PO TABS
40.0000 mg | ORAL_TABLET | Freq: Every morning | ORAL | Status: DC
  Administered 2015-03-25: 40 mg via ORAL
  Filled 2015-03-24: qty 1

## 2015-03-24 MED ORDER — PANTOPRAZOLE SODIUM 40 MG PO TBEC
40.0000 mg | DELAYED_RELEASE_TABLET | Freq: Every day | ORAL | Status: DC
  Administered 2015-03-25: 40 mg via ORAL
  Filled 2015-03-24: qty 1

## 2015-03-24 MED ORDER — IBUPROFEN 200 MG PO TABS
600.0000 mg | ORAL_TABLET | Freq: Three times a day (TID) | ORAL | Status: DC | PRN
  Administered 2015-03-24: 600 mg via ORAL
  Filled 2015-03-24: qty 3

## 2015-03-24 MED ORDER — SPIRONOLACTONE 25 MG PO TABS
25.0000 mg | ORAL_TABLET | Freq: Every day | ORAL | Status: DC
  Administered 2015-03-25: 25 mg via ORAL
  Filled 2015-03-24: qty 1

## 2015-03-24 MED ORDER — METFORMIN HCL 500 MG PO TABS
1000.0000 mg | ORAL_TABLET | Freq: Two times a day (BID) | ORAL | Status: DC
  Administered 2015-03-24 – 2015-03-25 (×2): 1000 mg via ORAL
  Filled 2015-03-24 (×4): qty 2

## 2015-03-24 MED ORDER — ISOSORBIDE MONONITRATE ER 60 MG PO TB24
60.0000 mg | ORAL_TABLET | Freq: Every morning | ORAL | Status: DC
  Administered 2015-03-25: 60 mg via ORAL
  Filled 2015-03-24: qty 1

## 2015-03-24 MED ORDER — CLOPIDOGREL BISULFATE 75 MG PO TABS
75.0000 mg | ORAL_TABLET | Freq: Every day | ORAL | Status: DC
  Administered 2015-03-25: 75 mg via ORAL
  Filled 2015-03-24 (×2): qty 1

## 2015-03-24 NOTE — ED Notes (Signed)
Pt states that he is beginning to feel anxious. This RN asked pt was has increased his anxiety. Pt states "I just don't know but I feel myself getting very anxious and I can't stop moving." This RN informed pt that he was safe and that I will see what I can administer. Pt states pain is 5/10

## 2015-03-24 NOTE — ED Notes (Signed)
Too early to give. This RN to ask EDP for order.

## 2015-03-24 NOTE — ED Provider Notes (Signed)
CSN: [id number]     Arrival date & time 03/24/15  1358 History   First MD Initiated Contact with Patient 03/24/15 1618     Chief Complaint  Patient presents with  . IVC    . Suicidal      (Consider location/radiation/quality/duration/timing/severity/associated sxs/prior Treatment) HPI  Jake Mayo is a(n) 49 y.o. male who presents  To the ed with CC of panic attacks and suicidal ideation with plan. The patient statest that he has a hx of panic and anxiety. He presents complaining of worsening anxiety. Patient was admitted for cardiac worse workup and felt to have anxiety by the cardiologist. He states that he is unable to sleep because he is waking up with panic attacks so frequently. His panic attacks have made him feel very depressed. He wrote a suicide note to his family, the other day, but balled up and threw it away. Patient states that he plans to overdose on his medication. He is a previous attempt of suicide with atenolol 25 years ago which required him to have oral charcoal monitoring and psych admission. The patient tells this provider he has been taking his medications as directed. He states he sometimes thinks about hurting other people. He denies audiovisual hallucinations. Patient also complaining of lower back pain. He fell off a chair while changing a light bulb and has significant hematoma to his lumbosacral region. He is able to ambulate but has significant pain. Past Medical History  Diagnosis Date  . Coronary artery disease   . Hypertension   . Diabetes mellitus without complication   . Obesity (BMI 30-39.9)   . Hyperlipidemia    Past Surgical History  Procedure Laterality Date  . Coronary angioplasty with stent placement    . Laparoscopic cholecystectomy    . Cardiac catheterization N/A 03/21/2015    Procedure: Left Heart Cath and Coronary Angiography;  Surgeon: Lyn Records, MD;  [facility] INVASIVE CV LAB;  Service: Cardiovascular;  Laterality: N/A;   No family  history on file. History  Substance Use Topics  . Smoking status: Former Smoker -- 1.00 packs/day for 30 years    Types: Cigarettes  . Smokeless tobacco: Never Used  . Alcohol Use: No    Review of Systems  Ten systems reviewed and are negative for acute change, except as noted in the HPI.    Allergies  Shellfish allergy  Home Medications   Prior to Admission medications   Medication Sig Start Date End Date Taking? Authorizing Provider  acetaminophen (TYLENOL) 325 MG tablet Take 2 tablets (650 mg total) by mouth every 4 (four) hours as needed for headache or mild pain. 03/22/15  Yes Abelino Derrick, PA-C  aspirin EC 81 MG tablet Take 81 mg by mouth every morning.   Yes Historical Provider, MD  atorvastatin (LIPITOR) 40 MG tablet Take 40 mg by mouth every evening.   Yes Historical Provider, MD  clopidogrel (PLAVIX) 75 MG tablet Take 1 tablet (75 mg total) by mouth daily with breakfast. 03/22/15  Yes Eda Paschal Kilroy, PA-C  eplerenone (INSPRA) 50 MG tablet Take 50 mg by mouth every evening.   Yes Historical Provider, MD  FLUoxetine (PROZAC) 20 MG capsule Take 20 mg by mouth every morning.   Yes Historical Provider, MD  insulin aspart (NOVOLOG) 100 UNIT/ML FlexPen Inject 5 Units into the skin 3 (three) times daily with meals.   Yes Historical Provider, MD  insulin detemir (LEVEMIR) 100 UNIT/ML injection Inject 50 Units into the skin 2 (two)  times daily.   Yes Historical Provider, MD  isosorbide mononitrate (IMDUR) 60 MG 24 hr tablet Take 60 mg by mouth every morning.   Yes Historical Provider, MD  lisinopril (PRINIVIL,ZESTRIL) 40 MG tablet Take 40 mg by mouth every morning.   Yes Historical Provider, MD  metFORMIN (GLUCOPHAGE) 1000 MG tablet Take 1 tablet (1,000 mg total) by mouth 2 (two) times daily with a meal. 03/25/15  Yes Eda PaschalLuke K Kilroy, PA-C  metoprolol succinate (TOPROL-XL) 50 MG 24 hr tablet Take 50 mg by mouth daily.  02/15/15  Yes Historical Provider, MD  pantoprazole (PROTONIX) 40 MG  tablet Take 1 tablet (40 mg total) by mouth daily at 12 noon. 03/22/15  Yes Luke K Kilroy, PA-C  nitroGLYCERIN (NITROSTAT) 0.4 MG SL tablet Place 1 tablet (0.4 mg total) under the tongue every 5 (five) minutes x 3 doses as needed for chest pain. Patient not taking: Reported on 03/24/2015 03/22/15   Eda PaschalLuke K Kilroy, PA-C   BP 147/92 mmHg  Pulse 98  Temp(Src) 98.6 F (37 C) (Oral)  Resp 16  SpO2 100% Physical Exam  Constitutional: He appears well-developed and well-nourished. No distress.  HENT:  Head: Normocephalic and atraumatic.  Eyes: Conjunctivae are normal. No scleral icterus.  Neck: Normal range of motion. Neck supple.  Cardiovascular: Normal rate, regular rhythm and normal heart sounds.   Pulmonary/Chest: Effort normal and breath sounds normal. No respiratory distress.  Abdominal: Soft. There is no tenderness.  Musculoskeletal: He exhibits no edema.       Back:  Neurological: He is alert.  Skin: Skin is warm and dry. He is not diaphoretic.  Psychiatric: His speech is normal and behavior is normal. Thought content normal. His mood appears anxious. He exhibits a depressed mood.  Nursing note and vitals reviewed.   ED Course  Procedures (including critical care time) Labs Review Labs Reviewed  ACETAMINOPHEN LEVEL - Abnormal; Notable for the following:    Acetaminophen (Tylenol), Serum <10 (*)    All other components within normal limits  COMPREHENSIVE METABOLIC PANEL - Abnormal; Notable for the following:    CO2 19 (*)    Glucose, Bld 195 (*)    All other components within normal limits  URINE RAPID DRUG SCREEN (HOSP PERFORMED) NOT [facility] - Abnormal; Notable for the following:    Benzodiazepines POSITIVE (*)    All other components within normal limits  CBC  ETHANOL  SALICYLATE LEVEL    Imaging Review No results found.   EKG Interpretation None      MDM   Final diagnoses:  None    Nursing reports. The patient is under involuntary commitment for suicide  plan. Patient will need psych evaluation.  Patient is medically clear for evaluaiton.   Arthor CaptainAbigail Kemal Amores, PA-C 03/26/15 1659  Layla MawKristen N Ward, DO 03/29/15 [phone]

## 2015-03-24 NOTE — ED Notes (Signed)
Pt with Hx of PTSD, IVCed with suicidal ideation with plan to jump off of roof, history of suicidal attempts. Pt has stopped taking mental health medications, pt unable to sleep due to anxiety and panic attacks, causing him to want "to end it all."  [facility] due to having medical conditions of insulin-dependent DM, HF, cardiac stents. [facility] states [facility] cannot return [facility] due to these conditions.

## 2015-03-24 NOTE — BH Assessment (Signed)
Tele Assessment Note   Jake Mayo is an 49 y.o. male. Pt presents under IVC to [facility] from [place]. Per chart review, pt transported from [place] d/t his medical conditions including HF, diabetes and having a cardiac stent. Pt is cooperative and oriented x 4 during teleassessment. Pt reports that he was speaking on the phone w/ a "social worker" from his insurance co when he expressed SI and the SW called EMS. He reports extreme anxiety with nightly panic attacks for the past two mos. Patient is tearful and states that he hasn't slept in 30 hrs. Pt denies HI. He denies [facility] and no delusions noted. Pt sts he moved from [place] to [place] to be near his parents who live in [place]. Pt sts if he can't reduce his anxiety, he plans to "take pills" or kill himself by carbon monoxide poisoning. He endorses one suicide attempt 25 yrs ago by ingesting "my heart medicine". He reports he was admitted to a MH facility after that attempt. Pt sts he doesn't have a psychiatrist or a therapist. Pt sts his girlfriend of 2 yrs left him two mos ago. He reports his father is ill. Pt endorses insomnia and fatigue. He says he has lost 8 lbs in past 10 days. He sts he is afraid to lie down at night because he has a panic attack every night while trying to fall asleep. Pt sts he has been to the ED in [place] several times in the past 2 mos and per chart review, pt has been to [facility] in the past few days for chest pain. He reports the MDs tell him that he is having panic attacks as opposed to the heart episodes he thinks he is having. Writer asks if he can keep himself safe if he is discharged. Pt reports, "If I'm discharged with the right medicine" he would be "okay". Pt sts that if he isn't discharged with medication to address his anxiety, then "it wouldn't be safe" for pt. Per pt, he has court date on 05/03/15 for traffic violation. Per website search, pt's 05/03/15 court date is for assault on a male. He reports he was adopted at  72 mos, and he doesn't know anything about his birth family's hx. Writer ran pt by Dahlia Byes NP who recommends pt be kept in the ED overnight to be evaluated by psychiatry in the am.   Axis I: Adjustment Disorder with Anxiety Axis II: Deferred Axis III:  Past Medical History  Diagnosis Date  . Coronary artery disease   . Hypertension   . Diabetes mellitus without complication   . Obesity (BMI 30-39.9)   . Hyperlipidemia    Axis IV: economic problems, other psychosocial or environmental problems, problems related to social environment and problems with primary support group Axis V: 31-40 impairment in reality testing  Past Medical History:  Past Medical History  Diagnosis Date  . Coronary artery disease   . Hypertension   . Diabetes mellitus without complication   . Obesity (BMI 30-39.9)   . Hyperlipidemia     Past Surgical History  Procedure Laterality Date  . Coronary angioplasty with stent placement    . Laparoscopic cholecystectomy    . Cardiac catheterization N/A 03/21/2015    Procedure: Left Heart Cath and Coronary Angiography;  Surgeon: Lyn Records, MD;  Location: [facility] INVASIVE CV LAB;  Service: Cardiovascular;  Laterality: N/A;    Family History: No family history on file.  Social History:  reports that he  has quit smoking. His smoking use included Cigarettes. He has a 30 pack-year smoking history. He has never used smokeless tobacco. He reports that he does not drink alcohol or use illicit drugs.  Additional Social History:  Alcohol / Drug Use Pain Medications: pt denies abuse - see PTA meds lsit Prescriptions: pt denies abuse - see PTA meds list Over the Counter: pt denies abuse = see PTA meds list History of alcohol / drug use?: No history of alcohol / drug abuse  CIWA: CIWA-Ar BP: 147/92 mmHg Pulse Rate: 98 COWS:    PATIENT STRENGTHS: (choose at least two) Ability for insight Average or above average intelligence Communication skills  Allergies:   Allergies  Allergen Reactions  . Shellfish Allergy Swelling    Home Medications:  (Not in a hospital admission)  OB/GYN Status:  No LMP for male patient.  General Assessment Data [facility] Assessment: In system Is this a Tele or Face-to-Face Assessment?: Tele Assessment Is this an Initial Assessment or a Re-assessment for this encounter?: Initial Assessment Marital status: Single Maiden name: na Is patient pregnant?: No Living Arrangements: Alone Can pt return to current living arrangement?: Yes Admission Status: Involuntary Is patient capable of signing voluntary admission?: Yes Referral Source: Other Museum/gallery curator) Insurance type: [employer]     Crisis Care Plan Living Arrangements: Alone Name of Psychiatrist: none Name of Therapist: none  Education Status Is patient currently in school?: No Highest grade of school patient has completed: 30 (and obtained GED)  Risk to self with the past 6 months Suicidal Ideation: Yes-Currently Present Has patient been a risk to self within the past 6 months prior to admission? : No Suicidal Intent: Yes-Currently Present Has patient had any suicidal intent within the past 6 months prior to admission? : No Is patient at risk for suicide?: Yes Suicidal Plan?: Yes-Currently Present Has patient had any suicidal plan within the past 6 months prior to admission? : No Specify Current Suicidal Plan: overdose on meds or carbon monoxide poisoning Access to Means: Yes Specify Access to Suicidal Means: access to pills and access to car What has been your use of drugs/alcohol within the last 12 months?: pt denies use Previous Attempts/Gestures: Yes How many times?: 1 (overdose attempt 25 yrs ago - given charcoal & psych admissi) Other Self Harm Risks: none Triggers for Past Attempts: Unpredictable Intentional Self Injurious Behavior: None Family Suicide History: Unknown (pt adopted) Recent stressful life event(s): Loss  (Comment), Recent negative physical changes (breakup w/ girlfriend, 2 mos panic attacks daily, ) Persecutory voices/beliefs?: No Depression: Yes Depression Symptoms: Insomnia, Fatigue (poor appetite) Substance abuse history and/or treatment for substance abuse?: No Suicide prevention information given to non-admitted patients: Not applicable  Risk to Others within the past 6 months Homicidal Ideation: No Does patient have any lifetime risk of violence toward others beyond the six months prior to admission? : No Thoughts of Harm to Others: No Current Homicidal Intent: No Current Homicidal Plan: No Access to Homicidal Means: No Identified Victim: none History of harm to others?: No (pt denies but charged w/ assault on male) Assessment of Violence: None Noted Violent Behavior Description: pt denies hx violence Does patient have access to weapons?: No Criminal Charges Pending?: Yes Describe Pending Criminal Charges: assault on male Does patient have a court date: Yes Court Date: 05/04/15 Is patient on probation?: No  Psychosis Hallucinations: None noted Delusions: None noted  Mental Status Report Appearance/Hygiene: Unremarkable, In scrubs Eye Contact: Fair Motor Activity: Freedom  of movement Speech: Logical/coherent Level of Consciousness: Quiet/awake, Alert Mood: Anxious Affect: Appropriate to circumstance, Anxious Anxiety Level: Panic Attacks Panic attack frequency: nightly for past 2 months Most recent panic attack: 03/23/15 Thought Processes: Relevant, Coherent Judgement: Unimpaired Orientation: Person, Place, Time, Situation Obsessive Compulsive Thoughts/Behaviors: None  Cognitive Functioning Concentration: Normal Memory: Recent Intact, Remote Intact IQ: Average Insight: Good Impulse Control: Good Appetite: Poor Weight Loss: 8 (lbs in 10 days) Sleep: Decreased Total Hours of Sleep:  (hasn't slept in 30 hours) Vegetative Symptoms: None  ADLScreening [facility]  Assessment Services) Patient's cognitive ability adequate to safely complete daily activities?: Yes Patient able to express need for assistance with ADLs?: Yes Independently performs ADLs?: Yes (appropriate for developmental age)  Prior Inpatient Therapy Prior Inpatient Therapy: Yes Prior Therapy Dates: 25 yrs ago Prior Therapy Facilty/Provider(s): in [place] Reason for Treatment: suicide attempt by overdose  Prior Outpatient Therapy Prior Outpatient Therapy: Yes Prior Therapy Dates: in the distant past Prior Therapy Facilty/Provider(s): unknown  Reason for Treatment: med management Does patient have an ACCT team?: No Does patient have Intensive In-House Services?  : No Does patient have [facility] services? : Unknown Does patient have [facility] services?: Unknown  ADL Screening (condition at time of admission) Patient's cognitive ability adequate to safely complete daily activities?: Yes Is the patient deaf or have difficulty hearing?: No Does the patient have difficulty seeing, even when wearing glasses/contacts?: No Does the patient have difficulty concentrating, remembering, or making decisions?: No Patient able to express need for assistance with ADLs?: Yes Does the patient have difficulty dressing or bathing?: No Independently performs ADLs?: Yes (appropriate for developmental age) Does the patient have difficulty walking or climbing stairs?: Yes Weakness of Legs: Left Weakness of Arms/Hands: None  Home Assistive Devices/Equipment Home Assistive Devices/Equipment: CBG Meter    Abuse/Neglect Assessment (Assessment to be complete while patient is alone) Physical Abuse: Denies Verbal Abuse: Denies Sexual Abuse: Denies Exploitation of patient/patient's resources: Denies Self-Neglect: Denies     Merchant navy officerAdvance Directives (For Healthcare) Does patient have an advance directive?: No    Additional Information 1:1 In Past 12 Months?: No CIRT Risk: No Elopement Risk: No Does  patient have medical clearance?: Yes     Disposition:  Disposition Initial Assessment Completed for this Encounter: Yes Disposition of Patient:  (josephine onuha rec observe overnite w/ psych eval 5/27 am)  Dhanush Jokerst P 03/24/2015 6:43 PM

## 2015-03-25 ENCOUNTER — Inpatient Hospital Stay (HOSPITAL_COMMUNITY)
Admission: AD | Admit: 2015-03-25 | Discharge: 2015-04-02 | DRG: 880 | Disposition: A | Discharge status: Home / Self Care | Payer: No Typology Code available for payment source | Attending: Psychiatry | Admitting: Psychiatry

## 2015-03-25 DIAGNOSIS — E119 Type 2 diabetes mellitus without complications: Secondary | ICD-10-CM | POA: Diagnosis present

## 2015-03-25 DIAGNOSIS — F332 Major depressive disorder, recurrent severe without psychotic features: Secondary | ICD-10-CM | POA: Diagnosis present

## 2015-03-25 DIAGNOSIS — Z955 Presence of coronary angioplasty implant and graft: Secondary | ICD-10-CM | POA: Diagnosis not present

## 2015-03-25 DIAGNOSIS — F410 Panic disorder [episodic paroxysmal anxiety] without agoraphobia: Principal | ICD-10-CM | POA: Diagnosis present

## 2015-03-25 DIAGNOSIS — F411 Generalized anxiety disorder: Secondary | ICD-10-CM | POA: Diagnosis present

## 2015-03-25 DIAGNOSIS — F419 Anxiety disorder, unspecified: Secondary | ICD-10-CM | POA: Diagnosis present

## 2015-03-25 DIAGNOSIS — I252 Old myocardial infarction: Secondary | ICD-10-CM | POA: Diagnosis not present

## 2015-03-25 DIAGNOSIS — Z794 Long term (current) use of insulin: Secondary | ICD-10-CM | POA: Diagnosis not present

## 2015-03-25 DIAGNOSIS — F17200 Nicotine dependence, unspecified, uncomplicated: Secondary | ICD-10-CM | POA: Diagnosis present

## 2015-03-25 DIAGNOSIS — F1020 Alcohol dependence, uncomplicated: Secondary | ICD-10-CM | POA: Diagnosis present

## 2015-03-25 DIAGNOSIS — I447 Left bundle-branch block, unspecified: Secondary | ICD-10-CM | POA: Diagnosis not present

## 2015-03-25 DIAGNOSIS — F1320 Sedative, hypnotic or anxiolytic dependence, uncomplicated: Secondary | ICD-10-CM | POA: Diagnosis present

## 2015-03-25 DIAGNOSIS — R45851 Suicidal ideations: Secondary | ICD-10-CM | POA: Diagnosis present

## 2015-03-25 DIAGNOSIS — I10 Essential (primary) hypertension: Secondary | ICD-10-CM | POA: Diagnosis present

## 2015-03-25 DIAGNOSIS — I25119 Atherosclerotic heart disease of native coronary artery with unspecified angina pectoris: Secondary | ICD-10-CM | POA: Diagnosis present

## 2015-03-25 LAB — GLUCOSE, CAPILLARY: GLUCOSE-CAPILLARY: 135 mg/dL — AB (ref 65–99)

## 2015-03-25 LAB — CBG MONITORING, ED
GLUCOSE-CAPILLARY: 158 mg/dL — AB (ref 65–99)
GLUCOSE-CAPILLARY: 135 mg/dL — AB (ref 65–99)
GLUCOSE-CAPILLARY: 124 mg/dL — AB (ref 65–99)
Glucose-Capillary: 119 mg/dL — ABNORMAL HIGH (ref 65–99)
Glucose-Capillary: 156 mg/dL — ABNORMAL HIGH (ref 65–99)

## 2015-03-25 MED ORDER — NITROGLYCERIN 0.4 MG SL SUBL: 0.4000 mg | SUBLINGUAL_TABLET | SUBLINGUAL | Status: DC | PRN

## 2015-03-25 MED ORDER — ASPIRIN EC 81 MG PO TBEC
81.0000 mg | DELAYED_RELEASE_TABLET | Freq: Every morning | ORAL | Status: DC
  Administered 2015-03-26 – 2015-04-02 (×8): 81 mg via ORAL
  Filled 2015-03-25 (×5): qty 1
  Filled 2015-03-25: qty 14
  Filled 2015-03-25 (×5): qty 1
  Filled 2015-03-25: qty 14
  Filled 2015-03-25: qty 1

## 2015-03-25 MED ORDER — PANTOPRAZOLE SODIUM 40 MG PO TBEC
40.0000 mg | DELAYED_RELEASE_TABLET | Freq: Every day | ORAL | Status: DC
  Administered 2015-03-26 – 2015-04-01 (×7): 40 mg via ORAL
  Filled 2015-03-25 (×9): qty 1
  Filled 2015-03-25: qty 14
  Filled 2015-03-25: qty 1
  Filled 2015-03-25: qty 14
  Filled 2015-03-25: qty 1

## 2015-03-25 MED ORDER — INSULIN ASPART 100 UNIT/ML ~~LOC~~ SOLN
5.0000 [IU] | Freq: Three times a day (TID) | SUBCUTANEOUS | Status: DC
  Administered 2015-03-26 – 2015-04-02 (×20): 5 [IU] via SUBCUTANEOUS

## 2015-03-25 MED ORDER — FLUOXETINE HCL 20 MG PO CAPS
20.0000 mg | ORAL_CAPSULE | Freq: Every morning | ORAL | Status: DC
  Administered 2015-03-26 – 2015-03-28 (×3): 20 mg via ORAL
  Filled 2015-03-25 (×5): qty 1

## 2015-03-25 MED ORDER — NICOTINE 21 MG/24HR TD PT24
21.0000 mg | MEDICATED_PATCH | Freq: Every day | TRANSDERMAL | Status: DC
  Administered 2015-03-25 – 2015-04-02 (×9): 21 mg via TRANSDERMAL
  Filled 2015-03-25 (×2): qty 1
  Filled 2015-03-25: qty 14
  Filled 2015-03-25 (×8): qty 1
  Filled 2015-03-25: qty 14

## 2015-03-25 MED ORDER — METOPROLOL SUCCINATE ER 50 MG PO TB24
50.0000 mg | ORAL_TABLET | Freq: Every day | ORAL | Status: DC
  Administered 2015-03-26 – 2015-04-02 (×8): 50 mg via ORAL
  Filled 2015-03-25 (×3): qty 1
  Filled 2015-03-25: qty 14
  Filled 2015-03-25 (×6): qty 1
  Filled 2015-03-25: qty 14
  Filled 2015-03-25 (×2): qty 1

## 2015-03-25 MED ORDER — HYDROXYZINE HCL 50 MG PO TABS
50.0000 mg | ORAL_TABLET | Freq: Three times a day (TID) | ORAL | Status: DC | PRN
  Administered 2015-03-25 – 2015-04-01 (×7): 50 mg via ORAL
  Filled 2015-03-25 (×7): qty 1

## 2015-03-25 MED ORDER — HYDROXYZINE HCL 25 MG PO TABS: 25.0000 mg | ORAL_TABLET | Freq: Three times a day (TID) | ORAL | Status: DC | PRN

## 2015-03-25 MED ORDER — CLOPIDOGREL BISULFATE 75 MG PO TABS
75.0000 mg | ORAL_TABLET | Freq: Every day | ORAL | Status: DC
  Administered 2015-03-26 – 2015-04-02 (×8): 75 mg via ORAL
  Filled 2015-03-25: qty 1
  Filled 2015-03-25: qty 14
  Filled 2015-03-25 (×5): qty 1
  Filled 2015-03-25: qty 14
  Filled 2015-03-25 (×3): qty 1

## 2015-03-25 MED ORDER — LISINOPRIL 40 MG PO TABS
40.0000 mg | ORAL_TABLET | Freq: Every morning | ORAL | Status: DC
  Administered 2015-03-26 – 2015-04-02 (×8): 40 mg via ORAL
  Filled 2015-03-25 (×11): qty 1

## 2015-03-25 MED ORDER — ATORVASTATIN CALCIUM 40 MG PO TABS
40.0000 mg | ORAL_TABLET | Freq: Every evening | ORAL | Status: DC
  Administered 2015-03-25 – 2015-04-01 (×8): 40 mg via ORAL
  Filled 2015-03-25 (×4): qty 1
  Filled 2015-03-25: qty 14
  Filled 2015-03-25 (×3): qty 1
  Filled 2015-03-25: qty 14
  Filled 2015-03-25 (×4): qty 1

## 2015-03-25 MED ORDER — METFORMIN HCL 500 MG PO TABS
1000.0000 mg | ORAL_TABLET | Freq: Two times a day (BID) | ORAL | Status: DC
  Administered 2015-03-25 – 2015-04-02 (×16): 1000 mg via ORAL
  Filled 2015-03-25 (×3): qty 2
  Filled 2015-03-25: qty 56
  Filled 2015-03-25: qty 2
  Filled 2015-03-25: qty 56
  Filled 2015-03-25 (×5): qty 2
  Filled 2015-03-25: qty 56
  Filled 2015-03-25 (×3): qty 2
  Filled 2015-03-25: qty 56
  Filled 2015-03-25 (×7): qty 2

## 2015-03-25 MED ORDER — ISOSORBIDE MONONITRATE ER 60 MG PO TB24
60.0000 mg | ORAL_TABLET | Freq: Every morning | ORAL | Status: DC
  Administered 2015-03-26 – 2015-04-02 (×8): 60 mg via ORAL
  Filled 2015-03-25 (×3): qty 1
  Filled 2015-03-25: qty 14
  Filled 2015-03-25 (×4): qty 1
  Filled 2015-03-25: qty 14
  Filled 2015-03-25 (×2): qty 1

## 2015-03-25 MED ORDER — INSULIN DETEMIR 100 UNIT/ML ~~LOC~~ SOLN
50.0000 [IU] | Freq: Two times a day (BID) | SUBCUTANEOUS | Status: DC
  Administered 2015-03-25 – 2015-04-02 (×16): 50 [IU] via SUBCUTANEOUS

## 2015-03-25 MED ORDER — SPIRONOLACTONE 25 MG PO TABS
25.0000 mg | ORAL_TABLET | Freq: Every day | ORAL | Status: DC
  Administered 2015-03-26 – 2015-04-02 (×8): 25 mg via ORAL
  Filled 2015-03-25 (×10): qty 1

## 2015-03-25 NOTE — Progress Notes (Signed)
Adult Psychoeducational Group Note  Date:  03/25/2015 Time:  8:31 PM  Group Topic/Focus:  Wrap-Up Group:   The focus of this group is to help patients review their daily goal of treatment and discuss progress on daily workbooks.  Participation Level:  Minimal  Participation Quality:  Appropriate  Affect:  Appropriate  Cognitive:  Appropriate  Insight: Appropriate  Engagement in Group:  Engaged  Modes of Intervention:  Discussion  Additional Comments:  Pt stated he could not think of anything to work on as a goal for tomorrow, but does want help with getting better sleep.  Caswell CorwinOwen, Brogan Martis C 03/25/2015, 8:31 PM

## 2015-03-25 NOTE — ED Notes (Signed)
Assumed care of patient. Patient sleeping at time of shift change. Sitter present at bedside. 

## 2015-03-25 NOTE — ED Notes (Signed)
Glucose 158
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
## 2015-03-25 NOTE — Progress Notes (Signed)
Pt given a 3 page list of [facility] providers within his [place] code [phone] after he stated he did not have a pcp to f/u with   [facility] ED CM spoke with pt on how to obtain an in network pcp with insurance coverage via the customer service number or web site  Cm reviewed ED level of care for crisis/emergent services and community pcp level of care to manage continuous or chronic medical concerns.  The pt voiced understanding CM encouraged pt and discussed pt's responsibility to verify with pt's insurance carrier that any recommended medical provider offered by any emergency room or a hospital provider is within the carrier's network. The pt voiced understanding

## 2015-03-25 NOTE — ED Notes (Signed)
[facility]ivian [facility]

## 2015-03-25 NOTE — BH Assessment (Signed)
[facility] Assessment Progress Note  Per Mojeed Akintayo, MD, this pt requires psychiatric hospitalization at this time.  Berneice Heinrichina Tate, RN, [facility].  Pt is under [facility], and upheld by Dr Jannifer FranklinAkintayo.  Pt has signed Consent to Release Information, and signed form has [facility].  Pt's nurse has been notified, and agrees to send signed document along with pt via [employer], and to call report to [phone].  Doylene Canninghomas Dsean Vantol, MA Triage Specialist [phone]

## 2015-03-25 NOTE — ED Notes (Signed)
First attempt made to call report, charge nurse unaware they're taking a patient, states she will call back.
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
## 2015-03-25 NOTE — ED Notes (Signed)
Pt sleeping at this time. Awaiting Levemir from pharmacy. Checked the Psych ED patient drawers, the main ED pyxis holding drawer, the tube station and tube station med box. Previous nurse stated she alerted pharmacy to need for medication, I just alerted the pharmacy tech to need for medication. Will follow up with pharmacy if I haven't heard back at noon.
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
## 2015-03-25 NOTE — ED Notes (Signed)
Glucose 119

## 2015-03-25 NOTE — Tx Team (Signed)
Initial Interdisciplinary Treatment Plan   PATIENT STRESSORS: Health problems Medication change or noncompliance   PATIENT STRENGTHS: Communication skills Supportive family/friends Work skills   PROBLEM LIST: Problem List/Patient Goals Date to be addressed Date deferred Reason deferred Estimated date of resolution  "work on my anger" 03/25/2015     "get some sleep" 03/25/2015                                                DISCHARGE CRITERIA:  Ability to meet basic life and health needs Medical problems require only outpatient monitoring Motivation to continue treatment in a less acute level of care Reduction of life-threatening or endangering symptoms to within safe limits Withdrawal symptoms are absent or subacute and managed without 24-hour nursing intervention  PRELIMINARY DISCHARGE PLAN: Attend aftercare/continuing care group Attend 12-step recovery group Outpatient therapy Return to previous living arrangement  PATIENT/FAMIILY INVOLVEMENT: This treatment plan has been presented to and reviewed with the patient, Elmon KirschnerDavid Cones The patient  has been given the opportunity to ask questions and make suggestions.  Jule SerKent, Kamerin Grumbine Gail 03/25/2015, 8:07 PM

## 2015-03-26 DIAGNOSIS — F410 Panic disorder [episodic paroxysmal anxiety] without agoraphobia: Secondary | ICD-10-CM | POA: Diagnosis present

## 2015-03-26 DIAGNOSIS — F332 Major depressive disorder, recurrent severe without psychotic features: Secondary | ICD-10-CM

## 2015-03-26 LAB — GLUCOSE, CAPILLARY
GLUCOSE-CAPILLARY: 134 mg/dL — AB (ref 65–99)
Glucose-Capillary: 84 mg/dL (ref 65–99)
Glucose-Capillary: 127 mg/dL — ABNORMAL HIGH (ref 65–99)
Glucose-Capillary: 135 mg/dL — ABNORMAL HIGH (ref 65–99)
Glucose-Capillary: 137 mg/dL — ABNORMAL HIGH (ref 65–99)

## 2015-03-26 MED ORDER — LORAZEPAM 1 MG PO TABS
1.0000 mg | ORAL_TABLET | Freq: Four times a day (QID) | ORAL | Status: DC | PRN
  Administered 2015-03-26 – 2015-03-28 (×7): 1 mg via ORAL
  Filled 2015-03-26 (×7): qty 1

## 2015-03-26 MED ORDER — IBUPROFEN 600 MG PO TABS
600.0000 mg | ORAL_TABLET | Freq: Four times a day (QID) | ORAL | Status: DC | PRN
Start: 2015-03-26 — End: 2015-04-02
  Administered 2015-03-26 – 2015-04-01 (×5): 600 mg via ORAL
  Filled 2015-03-26 (×5): qty 1

## 2015-03-26 MED ORDER — CHLORPROMAZINE HCL 25 MG PO TABS
25.0000 mg | ORAL_TABLET | Freq: Once | ORAL | Status: DC
  Filled 2015-03-26 (×2): qty 1

## 2015-03-26 MED ORDER — ZOLPIDEM TARTRATE 10 MG PO TABS
10.0000 mg | ORAL_TABLET | Freq: Every evening | ORAL | Status: DC | PRN
  Administered 2015-03-26 – 2015-04-01 (×7): 10 mg via ORAL
  Filled 2015-03-26 (×7): qty 1

## 2015-03-26 NOTE — Progress Notes (Signed)
Writer observed patient up in the dayroom watching tv with minimal interaction with peers. Writer spoke with him 1:1 and he reports that he is [facility] [place] but will need a TB and HIV test along with transportation there when accepted. He is very agitated and angry with his brother because he was told that he could no longer support him financially because their father is having to be placed in assisted living. He reports not sleeping well last night. Writer informed him of scheduled medications and he is agreeable to taking them later. He denies si/hi/a/v hallucinations. Support and encouraged him to speak with his case manager and doctor on tomorrow concerning his discharge plans. Safety maintained on unit with 15 min checks.

## 2015-03-26 NOTE — [facility] Group Notes (Signed)
[facility] Group Notes:  (Clinical Social Work)  03/26/2015     1:15-2:15PM  Summary of Progress/Problems:   The main focus of today's process group was to discuss barriers to using healthy coping skills.   The patient expressed that their unhealthy coping involves dwelling on his problems while a healthy thing he can do is to focus on solutions.  He left group for awhile, then returned and said he had just gotten a phone call from his brother stating his parents can no longer help him with expenses such as medications and he can no longer stay with them.  He was despondent for the remainder of group, holding his head and crying saying he does not know where to start with creating a new plan.  After group he asked for the [facility] [place] to call and see if he can go there, was provided that number by CSW.  Type of Therapy:  Group Therapy - Process   Participation Level:  Minimal  Participation Quality:  Sharing  Affect:  Depressed, Flat and Tearful  Cognitive:  Disorganized  Insight:  Limited  Engagement in Therapy:  Limited  Modes of Intervention:  Education, Motivational Interviewing  Ambrose MantleMareida Grossman-Orr, LCSW 03/26/2015, 3:48 PM

## 2015-03-26 NOTE — H&P (Signed)
Psychiatric Admission Assessment Adult  Patient Identification: Jake Mayo MRN:  [id number] Date of Evaluation:  03/26/2015 Chief Complaint:  ADJUSTMENT DISORDER WITH ANXIETY Principal Diagnosis: Major depressive disorder, recurrent severe without psychotic features Diagnosis:   Patient Active Problem List   Diagnosis Date Noted  . Generalized anxiety disorder [F41.1] 03/25/2015  . 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 LBBB  [I44.7] 03/22/2015  . ICM- EF 35-40% at cath 03/21/15 [I25.5] 03/22/2015  . Palpitations [R00.2]   . Non-STEMI- Troponin 0.23 [I21.4] 03/20/2015  . CAD S/P multiple prior PCIs, cath 02/2315- all patent [I25.10, Z98.61]   . Hypertension [I10]   . Diabetes mellitus without complication [E11.9]   . Obesity (BMI 30-39.9) [E66.9]   . Hyperlipidemia [E78.5]    History of Present Illness: Deric Bocock is a 49 y.o. male patient admitted with Generalized Anxiety disorder with panic.  Per ER note:  he was brought in under IVC from [place] for voicing suicide. Patient reports that he is suffering from" bad anxiety and nervous breakdown" Patient reported that his anxiety and nervous breakdown started two months ago since his breakup with his girlfriend and his father getting sick. Patient reports that he is about to go to jail for domestic violence. Patient reports intensive Panic attacks at times. Patient He also reports poor sleep but good appetite. He admitted to previous Suicide attempt 15-20 years ago but did not say what he did. Patient also reported some anger issues and irritability. Patient denies HI/AVH.  Patient was seen today, he is pacing hallway.  In interview, he had a flat affect, appeared irritated.  He stated that he recently had stent placed and was warned by his cardiologist to control his anxiety or be at risk for adverse consequence of a cardiac event.  He reports he had been depressed for a long  time.  He did not state any trigers such a death of loved one or job loss.  Only that his doctor years ago noticed he was "depressed, he put me on Paxil but I'm not on it anymore."  He also states that he was on Ambien and Klonopin for years but stopped it about 3 mos ago and he had gotten worse with his anxiety.  HPI Elements: Location: Generalized anxiety disorder, Panic attack. Quality: severe, suicidal ideation, irritability. Severity: severe. Timing: acute. Duration: two months. Context: IVC BY [facility] FOR SUICIDAL IDEATION.  Associated Signs/Symptoms: Depression Symptoms:  depressed mood, insomnia, psychomotor agitation, difficulty concentrating, hopelessness, anxiety, panic attacks, disturbed sleep, (Hypo) Manic Symptoms:  Irritable Mood, Labiality of Mood, Anxiety Symptoms:  Excessive Worry, Panic Symptoms, Psychotic Symptoms:  NA PTSD Symptoms: NA Total Time spent with patient: 45 minutes  Past Medical History:  Past Medical History  Diagnosis Date  . Coronary artery disease   . Hypertension   . Diabetes mellitus without complication   . Obesity (BMI 30-39.9)   . Hyperlipidemia     Past Surgical History  Procedure Laterality Date  . Coronary angioplasty with stent placement    . Laparoscopic cholecystectomy    . Cardiac catheterization N/A 03/21/2015    Procedure: Left Heart Cath and Coronary Angiography;  Surgeon: Lyn Records, MD;  Location: [facility] INVASIVE CV LAB;  Service: Cardiovascular;  Laterality: N/A;   Family History: No family history on file. Social History:  History  Alcohol Use No     History  Drug Use No    History   Social History  . Marital Status: Married  Spouse Name: N/A  . Number of Children: N/A  . Years of Education: N/A   Social History Main Topics  . Smoking status: Former Smoker -- 1.00 packs/day for 30 years    Types: Cigarettes  . Smokeless tobacco: Never Used  . Alcohol Use: No  . Drug Use: No  . Sexual  Activity: Not on file   Other Topics Concern  . Not on file   Social History Narrative   Truck driver      Additional Social History:  Musculoskeletal: Strength & Muscle Tone: within normal limits Gait & Station: normal Patient leans: N/A  Psychiatric Specialty Exam: Physical Exam  Vitals reviewed. Psychiatric: His mood appears anxious. His affect is labile. He is agitated. He exhibits a depressed mood.    Review of Systems  All other systems reviewed and are negative.   Blood pressure 122/79, pulse 72, temperature 98.9 F (37.2 C), temperature source Oral, resp. rate 18, height 5\' 11"  (1.803 m), weight 109.77 kg (242 lb), SpO2 99 %.Body mass index is 33.77 kg/(m^2).   General Appearance: Casual and Fairly Groomed  Patent attorneyye Contact:: Fair  Speech: Clear and Coherent and Normal Rate  Volume: Normal  Mood: Anxious and Depressed  Affect: Congruent and Depressed  Thought Process: Coherent, Goal Directed and Intact  Orientation: Full (Time, Place, and Person)  Thought Content: WDL  Suicidal Thoughts: No  Homicidal Thoughts: No  Memory: Immediate; Good Recent; Good Remote; Good  Judgement: Fair  Insight: Fair  Psychomotor Activity: Psychomotor Retardation  Concentration: Fair  Recall: NA  Fund of Knowledge:Fair  Language: Good  Akathisia: NA  Handed: Right  AIMS (if indicated):    Assets: Desire for Improvement  ADL's: Intact  Cognition: WNL  Sleep: 3 hrs         Risk to Self: Is patient at risk for suicide?: No What has been your use of drugs/alcohol within the last 12 months?: Haven't drank since last year when had heart attack.  Risk to Others:   Prior Inpatient Therapy:   Prior Outpatient Therapy:    Alcohol Screening: Patient refused Alcohol Screening Tool: Yes 1. How often do you have a drink containing alcohol?: Never 9. Have you or someone else been injured as a result of your drinking?: No 10. Has a  relative or friend or a doctor or another health worker been concerned about your drinking or suggested you cut down?: No Alcohol Use Disorder Identification Test Final Score (AUDIT): 0 Brief Intervention: Patient declined brief intervention  Allergies:   Allergies  Allergen Reactions  . Shellfish Allergy Swelling   Lab Results:  Results for orders placed or performed during the hospital encounter of 03/25/15 (from the past 48 hour(s))  Glucose, capillary     Status: Abnormal   Collection Time: 03/25/15  9:19 PM  Result Value Ref Range   Glucose-Capillary 135 (H) 65 - 99 mg/dL   Comment 1 Notify RN   Glucose, capillary     Status: None   Collection Time: 03/26/15  6:03 AM  Result Value Ref Range   Glucose-Capillary 84 65 - 99 mg/dL   Comment 1 Notify RN   Glucose, capillary     Status: Abnormal   Collection Time: 03/26/15  8:11 AM  Result Value Ref Range   Glucose-Capillary 127 (H) 65 - 99 mg/dL  Glucose, capillary     Status: Abnormal   Collection Time: 03/26/15 11:32 AM  Result Value Ref Range   Glucose-Capillary 134 (H) 65 - 99  mg/dL   Current Medications: Current Facility-Administered Medications  Medication Dose Route Frequency Provider Last Rate Last Dose  . aspirin EC tablet 81 mg  81 mg Oral q morning - 10a Earney Navy, NP   81 mg at 03/26/15 0806  . atorvastatin (LIPITOR) tablet 40 mg  40 mg Oral QPM Earney Navy, NP   40 mg at 03/25/15 1807  . clopidogrel (PLAVIX) tablet 75 mg  75 mg Oral Q breakfast Earney Navy, NP   75 mg at 03/26/15 0807  . FLUoxetine (PROZAC) capsule 20 mg  20 mg Oral q morning - 10a Earney Navy, NP   20 mg at 03/26/15 1610  . hydrOXYzine (ATARAX/VISTARIL) tablet 50 mg  50 mg Oral TID PRN Worthy Flank, NP   50 mg at 03/25/15 2214  . ibuprofen (ADVIL,MOTRIN) tablet 600 mg  600 mg Oral Q6H PRN Adonis Brook, NP   600 mg at 03/26/15 1106  . insulin aspart (novoLOG) injection 5 Units  5 Units Subcutaneous TID WC  Earney Navy, NP   5 Units at 03/26/15 1212  . insulin detemir (LEVEMIR) injection 50 Units  50 Units Subcutaneous Q12H Earney Navy, NP   50 Units at 03/26/15 0815  . isosorbide mononitrate (IMDUR) 24 hr tablet 60 mg  60 mg Oral q morning - 10a Earney Navy, NP   60 mg at 03/26/15 0808  . lisinopril (PRINIVIL,ZESTRIL) tablet 40 mg  40 mg Oral q morning - 10a Earney Navy, NP   40 mg at 03/26/15 0808  . LORazepam (ATIVAN) tablet 1 mg  1 mg Oral Q6H PRN Rachael Fee, MD   1 mg at 03/26/15 1215  . metFORMIN (GLUCOPHAGE) tablet 1,000 mg  1,000 mg Oral BID WC Earney Navy, NP   1,000 mg at 03/26/15 0809  . metoprolol succinate (TOPROL-XL) 24 hr tablet 50 mg  50 mg Oral Daily Earney Navy, NP   50 mg at 03/26/15 0809  . nicotine (NICODERM CQ - dosed in mg/24 hours) patch 21 mg  21 mg Transdermal Daily Earney Navy, NP   21 mg at 03/26/15 0811  . nitroGLYCERIN (NITROSTAT) SL tablet 0.4 mg  0.4 mg Sublingual Q5 Min x 3 PRN Earney Navy, NP      . pantoprazole (PROTONIX) EC tablet 40 mg  40 mg Oral Q1200 Earney Navy, NP      . spironolactone (ALDACTONE) tablet 25 mg  25 mg Oral Daily Earney Navy, NP   25 mg at 03/26/15 0810  . zolpidem (AMBIEN) tablet 10 mg  10 mg Oral QHS PRN Rachael Fee, MD       PTA Medications: Prescriptions prior to admission  Medication Sig Dispense Refill Last Dose  . acetaminophen (TYLENOL) 325 MG tablet Take 2 tablets (650 mg total) by mouth every 4 (four) hours as needed for headache or mild pain.   Past Month at Unknown time  . aspirin EC 81 MG tablet Take 81 mg by mouth every morning.   03/23/2015 at 2000  . atorvastatin (LIPITOR) 40 MG tablet Take 40 mg by mouth every evening.   03/23/2015 at 2000  . clopidogrel (PLAVIX) 75 MG tablet Take 1 tablet (75 mg total) by mouth daily with breakfast. 30 tablet 5 03/24/2015 at 0700  . eplerenone (INSPRA) 50 MG tablet Take 50 mg by mouth every evening.   03/24/2015 at 0700   . FLUoxetine (PROZAC) 20 MG capsule Take  20 mg by mouth every morning.   03/24/2015 at Unknown time  . insulin aspart (NOVOLOG) 100 UNIT/ML FlexPen Inject 5 Units into the skin 3 (three) times daily with meals.   03/24/2015 at 0800  . insulin detemir (LEVEMIR) 100 UNIT/ML injection Inject 50 Units into the skin 2 (two) times daily.   03/24/2015 at 0700  . isosorbide mononitrate (IMDUR) 60 MG 24 hr tablet Take 60 mg by mouth every morning.   03/24/2015 at 0700  . lisinopril (PRINIVIL,ZESTRIL) 40 MG tablet Take 40 mg by mouth every morning.   03/24/2015 at 0700  . metFORMIN (GLUCOPHAGE) 1000 MG tablet Take 1 tablet (1,000 mg total) by mouth 2 (two) times daily with a meal.   03/24/2015 at 0700  . metoprolol succinate (TOPROL-XL) 50 MG 24 hr tablet Take 50 mg by mouth daily.    03/24/2015 at 0700  . nitroGLYCERIN (NITROSTAT) 0.4 MG SL tablet Place 1 tablet (0.4 mg total) under the tongue every 5 (five) minutes x 3 doses as needed for chest pain. (Patient not taking: Reported on 03/24/2015) 25 tablet 2 Not Taking at Unknown time  . pantoprazole (PROTONIX) 40 MG tablet Take 1 tablet (40 mg total) by mouth daily at 12 noon. 30 tablet 5 03/24/2015 at 0700    Previous Psychotropic Medications: Yes   Substance Abuse History in the last 12 months:  No.    Consequences of Substance Abuse: NA  Results for orders placed or performed during the hospital encounter of 03/25/15 (from the past 72 hour(s))  Glucose, capillary     Status: Abnormal   Collection Time: 03/25/15  9:19 PM  Result Value Ref Range   Glucose-Capillary 135 (H) 65 - 99 mg/dL   Comment 1 Notify RN   Glucose, capillary     Status: None   Collection Time: 03/26/15  6:03 AM  Result Value Ref Range   Glucose-Capillary 84 65 - 99 mg/dL   Comment 1 Notify RN   Glucose, capillary     Status: Abnormal   Collection Time: 03/26/15  8:11 AM  Result Value Ref Range   Glucose-Capillary 127 (H) 65 - 99 mg/dL  Glucose, capillary     Status: Abnormal    Collection Time: 03/26/15 11:32 AM  Result Value Ref Range   Glucose-Capillary 134 (H) 65 - 99 mg/dL    Observation Level/Precautions:  15 minute checks  Laboratory:  per ED  Psychotherapy:  group  Medications:  As per medlist  Consultations:  As needed  Discharge Concerns:  safety  Estimated LOS: 2-7 days  Other:     Psychological Evaluations: Yes   Treatment Plan Summary: Admit for crisis management and mood stabilization. Medication management to re-stabilize current mood symptoms Group counseling sessions for coping skills Medical consults as needed Review and reinstate any pertinent home medications for other health problems  Medical Decision Making:  Review of Psycho-Social Stressors (1), Discuss test with performing physician (1), Review and summation of old records (2), Review of Medication Regimen & Side Effects (2) and Review of New Medication or Change in Dosage (2)  I certify that inpatient services furnished can reasonably be expected to improve the patient's condition.   Velna Hatchet May Agustin AGNP-BC 5/28/201612:30 PM I personally assessed the patient, reviewed the physical exam and labs and formulated the treatment plan Madie Reno A. Dub Mikes, M.D.

## 2015-03-26 NOTE — [facility] Suicide Risk Assessment (Signed)
[facility]   Nursing information obtained from:    Demographic factors:    Current Mental Status:    Loss Factors:    Historical Factors:    Risk Reduction Factors:    Total Time spent with patient: 45 minutes Principal Problem: <principal problem not specified> Diagnosis:   Patient Active Problem List   Diagnosis Date Noted  . Generalized anxiety disorder [F41.1] 03/25/2015  . 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 LBBB  [I44.7] 03/22/2015  . ICM- EF 35-40% at cath 03/21/15 [I25.5] 03/22/2015  . Palpitations [R00.2]   . Non-STEMI- Troponin 0.23 [I21.4] 03/20/2015  . CAD S/P multiple prior PCIs, cath 02/2315- all patent [I25.10, Z98.61]   . Hypertension [I10]   . Diabetes mellitus without complication [E11.9]   . Obesity (BMI 30-39.9) [E66.9]   . Hyperlipidemia [E78.5]      Continued Clinical Symptoms:  Alcohol Use Disorder Identification Test Final Score (AUDIT): 0 The "Alcohol Use Disorders Identification Test", Guidelines for Use in Primary Care, Second Edition.  World Science writerHealth Organization [facility](WHO). Score between 0-7:  no or low risk or alcohol related problems. Score between 8-15:  moderate risk of alcohol related problems. Score between 16-19:  high risk of alcohol related problems. Score 20 or above:  warrants further diagnostic evaluation for alcohol dependence and treatment.   CLINICAL FACTORS:   Severe Anxiety and/or Agitation Panic Attacks Depression:   Insomnia Medical Diagnoses and Treatments/Surgeries   Musculoskeletal: Strength & Muscle Tone: within normal limits Gait & Station: normal Patient leans: N/A  Psychiatric Specialty Exam: Physical Exam  Review of Systems  Constitutional: Positive for malaise/fatigue and diaphoresis.  HENT:       Small pounding  Eyes: Positive for blurred vision.  Respiratory: Positive for cough and shortness of breath.   Cardiovascular: Positive  for chest pain and palpitations.  Gastrointestinal: Negative.   Genitourinary: Negative.   Musculoskeletal: Positive for back pain and joint pain.  Skin: Negative.   Neurological: Positive for dizziness, tremors, weakness and headaches.  Endo/Heme/Allergies: Negative.   Psychiatric/Behavioral: The patient is nervous/anxious and has insomnia.     Blood pressure 122/79, pulse 72, temperature 98.9 F (37.2 C), temperature source Oral, resp. rate 18, height 5\' 11"  (1.803 m), weight 109.77 kg (242 lb), SpO2 99 %.Body mass index is 33.77 kg/(m^2).  General Appearance: Fairly Groomed  Patent attorneyye Contact::  Fair  Speech:  Clear and Coherent  Volume:  fluctuates  Mood:  Anxious, Depressed and Dysphoric  Affect:  Labile, Tearful and anxious worried  Thought Process:  Coherent and Goal Directed  Orientation:  Full (Time, Place, and Person)  Thought Content:  symptoms events worries concerns  Suicidal Thoughts:  No  Homicidal Thoughts:  No  Memory:  Immediate;   Fair Recent;   Fair Remote;   Fair  Judgement:  Fair  Insight:  Present and Shallow  Psychomotor Activity:  Restlessness  Concentration:  Fair  Recall:  FiservFair  Fund of Knowledge:Fair  Language: Fair  Akathisia:  No  Handed:  Right  AIMS (if indicated):     Assets:  Desire for Improvement  Sleep:  Number of Hours: 3  Cognition: WNL  ADL's:  Intact     COGNITIVE FEATURES THAT CONTRIBUTE TO RISK:  Closed-mindedness, Polarized thinking and Thought constriction (tunnel vision)    SUICIDE RISK:   Moderate:   49 Y/o male with a coronary artery disease who in the last several weeks has gone to the  ED multiple times complaining of chest pain. He has been evaluated and no acute changes were found that would point out towards more cardiac pathology. He was told that he was suffering from anxiety and that he needed psychiatric help. He has become more and more agitated unable to calm himself and worried he is going to have another heart attack  if he continues to experience the agitation he experiences. He has used Klonopin 1 mg BID before but found the Ativan he was given in the ED last night more effective. He uses Ambien for sleep PLAN OF CARE: Supportive approach/coping skills                               Panic anxiety; will use the Ativan 1 mg Q 6 HRS prn and reassess its effectiveness. Will chose an SSRI to help modify the course of the panic disorder. Will work with CBT/mindfulnee/relaxation techniques to help manage the anxiety attacks Insomnia; will use the Ambien 10 mg that he has found effective Note; he asked to be able to meet with me after our initial interaction. He was very upset. States that he just got a call from his family stating that his father was going to be placed in a nursing home and that they could not continue to help him financially or otherwise as the cost of the nursing home was too high. "They are going to cut me off." We were able to process the facts and and help modify his response resulting in marked decrease in the anxiety. It was pointe out to him he was doing a lot of catastrophic thinking Medical Decision Making:  Review of Psycho-Social Stressors (1), Review or order clinical lab tests (1), Review of Medication Regimen & Side Effects (2) and Review of New Medication or Change in Dosage (2)  I certify that inpatient services furnished can reasonably be expected to improve the patient's condition.   Jake Mayo A 03/26/2015, 11:57 AM

## 2015-03-26 NOTE — Progress Notes (Signed)
D. Per pt. Self inventory he reports that he slept poor last night. He reports that he received sleep medication, but it was not helpful. He reports a poor appetite and low energy level. He reports his depression 1/10, hopelessness 0/10 and anxiety 9/10. All on 1-10 scale, 10 being the worse. He c/o lower back pain d/t bruising from a fall prior to admission. Pt anxious on observation, easily agitated.    A. Collaboration with MD and Ativan 1mg  PO PRN ordered. Medication given as prescribed. Remains on special checks q 15mins to ensure his safety   R.Safety maintained. Compliant with medication regimen.

## 2015-03-26 NOTE — [facility] Group Notes (Signed)
[facility] Group Notes:  (Nursing/MHT/Case Management/Adjunct)  Date:  03/26/2015  Time:  11:46 AM  Type of Therapy:  Psychoeducational Skills  Participation Level:  Active  Participation Quality:  Appropriate  Affect:  Appropriate  Cognitive:  Appropriate  Insight:  Appropriate  Engagement in Group:  Engaged  Modes of Intervention:  Discussion  Summary of Progress/Problems: Pt did attend self inventory group, pt reported that he was negative SI/HI, no AH/VH noted. Pt rated his depression as a 0, his anxiety as a 10, and his helplessness/hopelessness as a 0.     Pt reported concerns regarding no being able to sleep last and increased anxiety, pt made aware that the doctor would be notified.  Jacquelyne BalintForrest, Albertha Beattie Shanta 03/26/2015, 11:46 AM

## 2015-03-26 NOTE — Progress Notes (Signed)
Adult Psychoeducational Group Note  Date:  03/26/2015 Time:  6:03 AM  Group Topic/Focus:  Wrap-Up Group:   The focus of this group is to help patients review their daily goal of treatment and discuss progress on daily workbooks.  Participation Level:  Active  Participation Quality:  Appropriate  Affect:  Appropriate  Cognitive:  Appropriate  Insight: Appropriate  Engagement in Group:  Engaged  Modes of Intervention:  Discussion  Additional Comments:  Pt stated he needs to work on his anger for tomorrow.  Caswell CorwinOwen, Jamiya Nims C 03/26/2015, 6:03 AM

## 2015-03-26 NOTE — Plan of Care (Signed)
Problem: Alteration in mood; excessive anxiety as evidenced by: Goal: STG-Pt can identify coping skills to manage panic/anxiety (Patient can identify at least ____ coping skills to manage panic/anxiety attack)  Outcome: Not Progressing Pt very anxious and  preoccupied about his pending court case unable to learn any coping skills.

## 2015-03-26 NOTE — Plan of Care (Signed)
Problem: Diagnosis: Increased Risk For Suicide Attempt Goal: STG-Patient Will Comply With Medication Regime Outcome: Progressing Patient is compliant with scheduled medications.     

## 2015-03-26 NOTE — Plan of Care (Signed)
Problem: Ineffective individual coping Goal: STG: Patient will remain free from self harm Outcome: Progressing Pt has remained free from self harm     

## 2015-03-26 NOTE — Progress Notes (Signed)
[facility] Group Notes:  (Nursing/MHT/Case Management/Adjunct)  Date:  03/26/2015  Time:  11:55 PM  Type of Therapy:  Psychoeducational Skills  Participation Level:  Active  Participation Quality:  Appropriate  Affect:  Appropriate  Cognitive:  Appropriate  Insight:  Appropriate  Engagement in Group:  Engaged  Modes of Intervention:  Discussion  Summary of Progress/Problems: Tonight in Wrap Up group, Jake Mayo said that he didn't have a good day. He began to mention how his family would not be able to support him anymore financially and he had concerns with this. He said he would speak with his SW about this and resources for him. He has some in mind now but will continue to fill SW in on plans. Jake Mayo 03/26/2015, 11:55 PM

## 2015-03-26 NOTE — BHH Counselor (Signed)
Adult Comprehensive Assessment  Patient ID: Jake Mayo, male   DOB: [birth year], 49 y.o.   MRN: [id number]  Information Source: Information source: Patient  Current Stressors:  Educational / Learning stressors: Patient denies.  Employment / Job issues: Patient currently unemployed, anxious because may have to return to working.  Family Relationships: Patient states his father is dying, and son is recently paralyzed from a car wreck in [place], [place]. Financial / Lack of resources (include bankruptcy): Currently unemployed Housing / Lack of housing: Was living in [place], [place], just up here visiting my parents, staying with them because don't have a place in [place]. Parents were supportinng himself because he was lving by himself and now because of health and issues, want him to find like a home to live in.  Physical health (include injuries & life threatening diseases): Major heart problems (9 stints in heart, oversized heart, CHF) diabetes Social relationships: Patient was in a relationship for 4 years, but he and girlfriend broke up 2 months ago. Substance abuse: Patient denies. Bereavement / Loss: Patient denies.  Living/Environment/Situation:  Living Arrangements: Parent Living conditions (as described by patient or guardian): Not good, don't fuss and fight, but don't like living there. Patient states parents don't want him there with them. 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  Does patient have children?: Yes How many children?: 2 (Ages 106 and 76).  How is patient's relationship with their children?: Patient states relationship was emotionally distant with both children. Patient states he speaks with one of his children  occasionally, but has recently begun speaking to his son in [place] regularly since his car accident.   Childhood History:  By whom was/is the patient raised?: Both parents Description of patient's relationship with caregiver when they were a child: Relationship was good with parents, had a great childhood. Patient's description of current relationship with people who raised him/her: Relationship is still good, but because "I have grown up and now with my nerves all tore up, I have changed. Especially with my daddy being real sick. I  can't handle that seeing how he is."  Does patient have siblings?: Yes Number of Siblings: 2 Description of patient's current relationship with siblings: Patient does not have a good relationship with siblings.  Did patient suffer any verbal/emotional/physical/sexual abuse as a child?: No Did patient suffer from severe childhood neglect?: No Has patient ever been sexually abused/assaulted/raped as an adolescent or adult?: No Was the patient ever a victim of a crime or a disaster?: No Witnessed domestic violence?: No Has patient been effected by domestic violence as an adult?: No  Education:  Highest grade of school patient has completed: GED Currently a Consulting civil engineer?: No Learning disability?: Yes What learning problems does patient have?: ADD, ADHD, polymotor retardation skills  Employment/Work Situation:   Employment situation: Unemployed Patient's job has been impacted by current illness: Yes Describe how patient's job has been impacted: Patient states he doesn't have the energy to work because of his heart. 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 Patient states he applied for disability 6-8  months ago.  Does patient have a representative payee or guardian?: No  Alcohol/Substance Abuse:   What has been your use of drugs/alcohol within the last 12 months?: Patient denies alcohol abuse for approximately one year, due to a heart attack.  If attempted suicide, did drugs/alcohol play a role in this?: No Alcohol/Substance Abuse Treatment Hx: Past Tx, Inpatient If yes, describe treatment: [address] in [place] (13months) Has alcohol/substance abuse ever caused legal problems?: Yes (Some DUI's but was never convicted. )  Social Support System:   Patient's Community Support System: Fair Museum/gallery exhibitions officer System: Patient lists parents and brother as support system. Type of faith/religion: Ephriam Knuckles How does patient's faith help to cope with current illness?: I don't use it.  Leisure/Recreation:   Leisure and Hobbies: I like to watch tv. Im a tv addict. I like to go walking sometimes, go fishing.  Strengths/Needs:   What things does the patient do well?: I love people, I love helping people. In what areas does patient struggle / problems for patient: Patient struggles with anger, anxiety, eating, relationships.  Discharge Plan:   Does patient have access to transportation?: No Plan for no access to transportation at discharge: Patient states he spoke with police and they told him all he has to do is just call them, but other than that he has no plan. Patient states his parents are elderly and unable to pick him up. Patient states he does not have money for transportation and his brother resides in [place], [place]. Will patient be returning to same living situation after discharge?: Yes Currently receiving community mental health services: No If no, would patient like referral for services when discharged?: Yes (What county[place] Does patient have financial barriers related to discharge medications?: No  Summary/Recommendations:   Summary and  Recommendations (to be completed by the evaluator): Patient is a 49 year old Caucasian male residing in [place], [place] with parents at [facility] Patient was involuntarily admitted after making statements of being overwhelmed and wanting to harm himself to Child psychotherapist on the phone while at [employer]. Patient has been diagnosed with generalized anxiety disorder and has been experiencing increasing feelings of anxiety for approximately 2 months which he attributes to breaking up with girlfriend, father's physical condition worsening, and son getting into a car accident and being paralyzed as a result. Patient was hospitalized over 20 years ago for suicide attempt. Patient states he has not been receiving OP therapy and states he was "managing just fine," but with poor health conditions and being unable to live on his own, his anxiety has "gotten worse." Patient was living in an ALF in [place] and moved into his own apartment because he felt he could manage. Patient's parents were financially supporting patient, but insisted patient find alternative housing in a group home or another ALF because of his inability to manage mentally or physically. Patient lists increasing anxiety "to the point of panic attacks," as a result of living with parents and witnessing father's health decline, an inability to work, and inability to "see about my son." Patient states he feels "safer" in an ALF and would like to explore one in [place], so he can be closer to his parents.  Recommendation: Patient would benefit from crisis stabilization, medication management for mood stabilization, therapy groups for processing, psycho educational group for increasing coping skills, and aftercare planning. The patient REFUSED referral  to Quitline for smoking cessation, as he states he does not smoke. :The Discharge Process and Patient Involvement form was reviewed with patient at the end of the Psychosocial Assessment, and the patient  confirmed understanding and signed the document, which was placed in the paper chart. Suicide Prevention was reviewed thoroughly, and a brochure left with patient. The patient REFUSED for SPE to be provided to someone in his life.     Patrick-Jefferson,Janiaya Ryser. 03/26/2015

## 2015-03-27 ENCOUNTER — Encounter (HOSPITAL_COMMUNITY): Payer: Self-pay | Admitting: Registered Nurse

## 2015-03-27 LAB — GLUCOSE, CAPILLARY
GLUCOSE-CAPILLARY: 119 mg/dL — AB (ref 65–99)
GLUCOSE-CAPILLARY: 167 mg/dL — AB (ref 65–99)
Glucose-Capillary: 136 mg/dL — ABNORMAL HIGH (ref 65–99)
Glucose-Capillary: 103 mg/dL — ABNORMAL HIGH (ref 65–99)

## 2015-03-27 NOTE — Progress Notes (Signed)
Writer spoke with patient 1:1 after giving him his scheduled insulin. He reported having a good day. He spoke of an incident that took place on the unit that made him very upset because of what another patient said from the 300 hall. He reports that he wish he could take some medicine to help with his anger. He has been observed up in the dayroom interacting with peers and watching tv. He denies si/hi/a/v hallucinations. He requested his ativan for anxiety and it was given at appropriate time. Support and encouragement given, safety maintained on unit with 15 min checks.

## 2015-03-27 NOTE — Progress Notes (Signed)
D: Patient is less irritable today.  He continues to have severe anxiety and requests his medication for same. He denies any SI/HI/AVH.  He continues to pace at times.  His affect is flat and blunted.  His mood is depressed.   A: Continue to monitor medication management and MD orders.  Safety checks completed every 15 minutes per protocol.  Meet 1:1 with patient to discuss concerns and offer encouragement. R: Patient is calm and cooperative at this time.

## 2015-03-27 NOTE — [facility] Group Notes (Signed)
[facility] Group Notes:  (Clinical Social Work)  03/27/2015  1:15-2:15PM  Summary of Progress/Problems:   The main focus of today's process group was to   1)  discuss the importance of adding supports  2)  define health supports versus unhealthy supports  3)  identify the patient's current unhealthy supports and plan how to handle them  4)  Identify the patient's current healthy supports and plan what to add.  An emphasis was placed on using counselor, doctor, therapy groups, 12-step groups, and problem-specific support groups to expand supports.    The patient expressed full comprehension of the concepts presented, and agreed that there is a need to add more supports.  The patient stated that he talked to [facility], and they will require [facility] to [facility] and will come talk to him there about admission.  Therefore he is interested in applying for admission [facility].  He is also possibly interested in [facility] list.  Type of Therapy:  Process Group with Motivational Interviewing  Participation Level:  Active  Participation Quality:  Attentive and Sharing  Affect:  Depressed, Flat and Irritable  Cognitive:  Alert  Insight:  Developing/Improving  Engagement in Therapy:  Engaged  Modes of Intervention:   Education, Support and Processing, Activity  Ambrose MantleMareida Grossman-Orr, LCSW 03/27/2015

## 2015-03-27 NOTE — Progress Notes (Signed)
Patient stated he feels jittery this afternoon, requested ativan before it can be given.  Denied SI and HI.  Denied A/V hallucinations.  Stated "When I was in ER awhile back, I tore up the room and they told me not to come back.  I told Dr. Dub MikesLugo."  Respirations even and unlabored.  No signs/symptoms of pain/distress noted on patient's face/body movements.  Safety maintained with 15 minute checks.

## 2015-03-27 NOTE — Progress Notes (Signed)
[facility] MD Progress Note  03/27/2015 10:56 AM Jake Mayo  MRN:  [id number]   Subjective:  Patient upset related to another patient saying that he doesn't give a damn about nobody here; "then he called me out in the hall and told me he wasn't scared of me.  I got to many damn problems to be worried about shit like this."   Patient states that he is here related to anxiety, I can't sleep, I ain't got no where to live; I need my medicine and ain't got no way to get them filled.  My family called and told me they was going to have to cut me off cause my daddy got sick; and having panic attacks."   Patient denies suicidal thought "if I did I wouldn't say nothing; ain't no need in telling no body if you gone do it.  I don't want nobody stopping me."  Patient also states that he paranoid that people are talking about him, laughing at him, what they are saying about me when I walk into a room.  "I also have a problem with anger like with this guy right here; I just wanted to beat the hell out of him.  Right now I want to beat the hell out of my girlfriend; I just want to kill her. Last night I dreamed that she was killed.  She broke up with me.  After getting out of the hospital from having a heart attack; I'm getting arrested for assault and I ain't even done nothing.  Now I got to go to court July 1st and I ain't never been in court for nothing like that."  Objective:  Patient seen face to face, chart reviewed and discussed with nursing.  Tolerating medications without adverse effects; Attending and participating in group sessions.  No changes in medications at this time.  Ativan was started yesterday.  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 Generalized anxiety disorder [F41.1] 03/25/2015  . Major depressive disorder,  recurrent severe without psychotic features [F33.2] 03/25/2015  . LBBB  [I44.7] 03/22/2015  . ICM- EF 35-40% at cath 03/21/15 [I25.5] 03/22/2015  . Palpitations [R00.2]   . Non-STEMI- Troponin 0.23 [I21.4] 03/20/2015  . CAD S/P multiple prior PCIs, cath 02/2315- all patent [I25.10, Z98.61]   . Hypertension [I10]   . Diabetes mellitus without complication [E11.9]   . Obesity (BMI 30-39.9) [E66.9]   . Hyperlipidemia [E78.5]    Total Time spent with patient: 30 minutes   Past Medical History:  Past Medical History  Diagnosis Date  . Coronary artery disease   . Hypertension   . Diabetes mellitus without complication   . Obesity (BMI 30-39.9)   . Hyperlipidemia     Past Surgical History  Procedure Laterality Date  . Coronary angioplasty with stent placement    . Laparoscopic cholecystectomy    . Cardiac catheterization N/A 03/21/2015    Procedure: Left Heart Cath and Coronary Angiography;  Surgeon: Lyn Records, MD;  Location: [facility] INVASIVE CV LAB;  Service: Cardiovascular;  Laterality: N/A;   Family History: History reviewed. No pertinent family history. Social History:  History  Alcohol Use No     History  Drug Use No    History   Social History  . Marital Status: Married    Spouse Name: N/A  .  Number of Children: N/A  . Years of Education: N/A   Social History Main Topics  . Smoking status: Former Smoker -- 1.00 packs/day for 30 years    Types: Cigarettes  . Smokeless tobacco: Never Used  . Alcohol Use: No  . Drug Use: No  . Sexual Activity: Not on file   Other Topics Concern  . None   Social History Narrative   Truck driver      Additional History:    Sleep: Fair, "Last night is the first night that I slept good in 2 months"  Appetite:  Poor   Assessment:   Musculoskeletal: Strength & Muscle Tone: within normal limits Gait & Station: normal Patient leans: N/A   Psychiatric Specialty Exam: Physical Exam  Nursing note and vitals  reviewed. Constitutional: He is oriented to person, place, and time.  Neck: Normal range of motion.  Respiratory: Effort normal and breath sounds normal.  Neurological: He is alert and oriented to person, place, and time.  Psychiatric: His speech is normal. His mood appears anxious. His affect is angry. He is agitated and combative. Cognition and memory are normal. He exhibits a depressed mood. He expresses suicidal ideation.    Review of Systems  Cardiovascular:       Hx MI  Psychiatric/Behavioral: Positive for depression and suicidal ideas. Negative for hallucinations. The patient is nervous/anxious. The patient does not have insomnia.   All other systems reviewed and are negative.   Blood pressure 122/79, pulse 72, temperature 98.9 F (37.2 C), temperature source Oral, resp. rate 18, height  (1.803 m), weight 109.77 kg (242 lb), SpO2 99 %.Body mass index is 33.77 kg/(m^2).  General Appearance: Casual  Eye Contact::  Fair  Speech:  Clear and Coherent and Normal Rate  Volume:  Normal  Mood:  Anxious, Depressed, Hopeless, Irritable and Worthless  Affect:  Depressed and Flat  Thought Process:  Circumstantial  Orientation:  Full (Time, Place, and Person)  Thought Content:  Paranoid Ideation and Rumination  Suicidal Thoughts:  Yes.  without intent/plan  Homicidal Thoughts:  Yes.  without intent/plan  Memory:  Immediate;   Good Recent;   Good Remote;   Good  Judgement:  Poor  Insight:  Lacking  Psychomotor Activity:  Normal  Concentration:  Fair  Recall:  Good  Fund of Knowledge:Fair  Language: Good  Akathisia:  Negative  Handed:  Right  AIMS (if indicated):     Assets:  Communication Skills  ADL's:  Intact  Cognition: WNL  Sleep:  Number of Hours: 3     Current Medications: Current Facility-Administered Medications  Medication Dose Route Frequency Provider Last Rate Last Dose  . aspirin EC tablet 81 mg  81 mg Oral q morning - 10a Earney Navy, NP   81 mg at  03/27/15 0816  . atorvastatin (LIPITOR) tablet 40 mg  40 mg Oral QPM Earney Navy, NP   40 mg at 03/26/15 1720  . clopidogrel (PLAVIX) tablet 75 mg  75 mg Oral Q breakfast Earney Navy, NP   75 mg at 03/27/15 0807  . FLUoxetine (PROZAC) capsule 20 mg  20 mg Oral q morning - 10a Earney Navy, NP   20 mg at 03/27/15 0454  . hydrOXYzine (ATARAX/VISTARIL) tablet 50 mg  50 mg Oral TID PRN Worthy Flank, NP   50 mg at 03/25/15 2214  . ibuprofen (ADVIL,MOTRIN) tablet 600 mg  600 mg Oral Q6H PRN Adonis Brook, NP   600 mg  at 03/26/15 2012  . insulin aspart (novoLOG) injection 5 Units  5 Units Subcutaneous TID WC Earney Navy, NP   5 Units at 03/27/15 0820  . insulin detemir (LEVEMIR) injection 50 Units  50 Units Subcutaneous Q12H Earney Navy, NP   50 Units at 03/27/15 0821  . isosorbide mononitrate (IMDUR) 24 hr tablet 60 mg  60 mg Oral q morning - 10a Earney Navy, NP   60 mg at 03/27/15 0816  . lisinopril (PRINIVIL,ZESTRIL) tablet 40 mg  40 mg Oral q morning - 10a Earney Navy, NP   40 mg at 03/27/15 0816  . LORazepam (ATIVAN) tablet 1 mg  1 mg Oral Q6H PRN Rachael Fee, MD   1 mg at 03/27/15 0817  . metFORMIN (GLUCOPHAGE) tablet 1,000 mg  1,000 mg Oral BID WC Earney Navy, NP   1,000 mg at 03/27/15 0808  . metoprolol succinate (TOPROL-XL) 24 hr tablet 50 mg  50 mg Oral Daily Earney Navy, NP   50 mg at 03/27/15 0815  . nicotine (NICODERM CQ - dosed in mg/24 hours) patch 21 mg  21 mg Transdermal Daily Earney Navy, NP   21 mg at 03/27/15 0808  . nitroGLYCERIN (NITROSTAT) SL tablet 0.4 mg  0.4 mg Sublingual Q5 Min x 3 PRN Earney Navy, NP      . pantoprazole (PROTONIX) EC tablet 40 mg  40 mg Oral Q1200 Earney Navy, NP   40 mg at 03/26/15 1302  . spironolactone (ALDACTONE) tablet 25 mg  25 mg Oral Daily Earney Navy, NP   25 mg at 03/27/15 1610  . zolpidem (AMBIEN) tablet 10 mg  10 mg Oral QHS PRN Rachael Fee, MD   10 mg  at 03/26/15 2105    Lab Results:  Results for orders placed or performed during the hospital encounter of 03/25/15 (from the past 48 hour(s))  Glucose, capillary     Status: Abnormal   Collection Time: 03/25/15  9:19 PM  Result Value Ref Range   Glucose-Capillary 135 (H) 65 - 99 mg/dL   Comment 1 Notify RN   Glucose, capillary     Status: None   Collection Time: 03/26/15  6:03 AM  Result Value Ref Range   Glucose-Capillary 84 65 - 99 mg/dL   Comment 1 Notify RN   Glucose, capillary     Status: Abnormal   Collection Time: 03/26/15  8:11 AM  Result Value Ref Range   Glucose-Capillary 127 (H) 65 - 99 mg/dL  Glucose, capillary     Status: Abnormal   Collection Time: 03/26/15 11:32 AM  Result Value Ref Range   Glucose-Capillary 134 (H) 65 - 99 mg/dL  Glucose, capillary     Status: Abnormal   Collection Time: 03/26/15  4:36 PM  Result Value Ref Range   Glucose-Capillary 135 (H) 65 - 99 mg/dL  Glucose, capillary     Status: Abnormal   Collection Time: 03/26/15  8:51 PM  Result Value Ref Range   Glucose-Capillary 137 (H) 65 - 99 mg/dL    Physical Findings: AIMS:  , ,  ,  ,    CIWA:    COWS:     Treatment Plan Summary: Daily contact with patient to assess and evaluate symptoms and progress in treatment and Medication management  1. Admit for crisis management and stabilization 2. Medication management to reduce current symptoms to bale line and improve the patient's overall level of functioning:  On  03/26/2015 Added Ambien 10 mg Q hs and Ativan 1 mg Q 6 hrs prn anxiety and restarted home medications 3. Treat health problems as indicated 4. Develop treatment plan to decrease risk of relapse upon discharge and the need for readmission. 5. Psycho-social education regarding relapse prevention and self care. 6. Health care follow up as needed for medical problems 7. Restart home medications where appropriate.    Medical Decision Making:  Review of Psycho-Social Stressors (1), Review  or order clinical lab tests (1), Review of Last Therapy Session (1), Independent Review of image, tracing or specimen (2) and Review of Medication Regimen & Side Effects (2)   Rankin, Shuvon, FNP-BC 03/27/2015, 10:56 AM I agree with assessment and plan Madie RenoIrving A. Dub MikesLugo, M.D.

## 2015-03-27 NOTE — [facility] Group Notes (Signed)
[facility] Group Notes:  (Nursing/MHT/Case Management/Adjunct)  Date:  03/27/2015  Time: 1000 am  Type of Therapy:  Psychoeducational Skills  Participation Level:  Did Not Attend   Cranford MonBeaudry, Rylyn Ranganathan Evans 03/27/2015, 10:48 AM

## 2015-03-28 DIAGNOSIS — F1020 Alcohol dependence, uncomplicated: Secondary | ICD-10-CM | POA: Diagnosis present

## 2015-03-28 DIAGNOSIS — I447 Left bundle-branch block, unspecified: Secondary | ICD-10-CM

## 2015-03-28 DIAGNOSIS — F410 Panic disorder [episodic paroxysmal anxiety] without agoraphobia: Principal | ICD-10-CM

## 2015-03-28 DIAGNOSIS — F1320 Sedative, hypnotic or anxiolytic dependence, uncomplicated: Secondary | ICD-10-CM

## 2015-03-28 LAB — GLUCOSE, CAPILLARY
GLUCOSE-CAPILLARY: 96 mg/dL (ref 65–99)
GLUCOSE-CAPILLARY: 132 mg/dL — AB (ref 65–99)
Glucose-Capillary: 108 mg/dL — ABNORMAL HIGH (ref 65–99)

## 2015-03-28 MED ORDER — SERTRALINE HCL 25 MG PO TABS
25.0000 mg | ORAL_TABLET | Freq: Every day | ORAL | Status: DC
  Administered 2015-03-28 – 2015-03-29 (×2): 25 mg via ORAL
  Filled 2015-03-28 (×6): qty 1

## 2015-03-28 MED ORDER — TUBERCULIN PPD 5 UNIT/0.1ML ID SOLN
5.0000 [IU] | Freq: Once | INTRADERMAL | Status: AC
  Administered 2015-03-28: 5 [IU] via INTRADERMAL

## 2015-03-28 MED ORDER — CLONAZEPAM 0.5 MG PO TABS
0.5000 mg | ORAL_TABLET | Freq: Three times a day (TID) | ORAL | Status: DC | PRN
  Administered 2015-03-28 – 2015-03-31 (×7): 0.5 mg via ORAL
  Filled 2015-03-28 (×7): qty 1

## 2015-03-28 NOTE — Consult Note (Signed)
Patient Demographics  Jake Mayo, is a 49 y.o. male   MRN: [id number]   DOB - 1966/01/19  Admit Date - 03/25/2015    Outpatient Primary MD for the patient is Pcp Not In System  Consult requested in the Hospital by Craige Cotta, MD, On 03/28/2015    Reason for consult  Abnormal EKG   With History of -  Past Medical History  Diagnosis Date  . Coronary artery disease   . Hypertension   . Diabetes mellitus without complication   . Obesity (BMI 30-39.9)   . Hyperlipidemia       Past Surgical History  Procedure Laterality Date  . Coronary angioplasty with stent placement    . Laparoscopic cholecystectomy    . Cardiac catheterization N/A 03/21/2015    Procedure: Left Heart Cath and Coronary Angiography;  Surgeon: Lyn Records, MD;  Location: [facility] INVASIVE CV LAB;  Service: Cardiovascular;  Laterality: N/A;    in for   No chief complaint on file.    HPI  Jake Mayo  is a 49 y.o. male, was admitted to inpatient psych due to depression and anxiety, hospitalist called for consult due to EKg changes, patient does report intermittent palpitation but none currently , he also denies chest pain, no sob, no edema, no cough, no dizziness,no nocturnal dyspnea. Reported quit smoking a year ago, quit drinking one to two weeks ago.    Review of Systems    In addition to the HPI above,  No Fever-chills, No Headache, No changes with Vision or hearing, No problems swallowing food or Liquids, No Chest pain, Cough or Shortness of Breath, No Abdominal pain, No Nausea or Vommitting, Bowel movements are regular, No Blood in stool or Urine, No dysuria, No new skin rashes or bruises, No new joints pains-aches,  No new weakness, tingling, numbness in any extremity, No recent weight gain or loss, No polyuria, polydypsia or polyphagia, No significant Mental Stressors.  A full 10 point Review of  Systems was done, except as stated above, all other Review of Systems were negative.   Social History History  Substance Use Topics  . Smoking status: Former Smoker -- 1.00 packs/day for 30 years    Types: Cigarettes  . Smokeless tobacco: Never Used  . Alcohol Use: No     Family History History reviewed. No pertinent family history.   Prior to Admission medications   Medication Sig Start Date End Date Taking? Authorizing Provider  acetaminophen (TYLENOL) 325 MG tablet Take 2 tablets (650 mg total) by mouth every 4 (four) hours as needed for headache or mild pain. 03/22/15   Abelino Derrick, PA-C  aspirin EC 81 MG tablet Take 81 mg by mouth every morning.    Historical Provider, MD  atorvastatin (LIPITOR) 40 MG tablet Take 40 mg by mouth every evening.    Historical Provider, MD  clopidogrel (PLAVIX) 75 MG tablet Take 1 tablet (75 mg total) by mouth daily with breakfast. 03/22/15   Abelino Derrick,  PA-C  eplerenone (INSPRA) 50 MG tablet Take 50 mg by mouth every evening.    Historical Provider, MD  FLUoxetine (PROZAC) 20 MG capsule Take 20 mg by mouth every morning.    Historical Provider, MD  insulin aspart (NOVOLOG) 100 UNIT/ML FlexPen Inject 5 Units into the skin 3 (three) times daily with meals.    Historical Provider, MD  insulin detemir (LEVEMIR) 100 UNIT/ML injection Inject 50 Units into the skin 2 (two) times daily.    Historical Provider, MD  isosorbide mononitrate (IMDUR) 60 MG 24 hr tablet Take 60 mg by mouth every morning.    Historical Provider, MD  lisinopril (PRINIVIL,ZESTRIL) 40 MG tablet Take 40 mg by mouth every morning.    Historical Provider, MD  metFORMIN (GLUCOPHAGE) 1000 MG tablet Take 1 tablet (1,000 mg total) by mouth 2 (two) times daily with a meal. 03/25/15   Abelino DerrickLuke K Kilroy, PA-C  metoprolol succinate (TOPROL-XL) 50 MG 24 hr tablet Take 50 mg by mouth daily.  02/15/15   Historical Provider, MD  nitroGLYCERIN (NITROSTAT) 0.4 MG SL tablet Place 1 tablet (0.4 mg total)  under the tongue every 5 (five) minutes x 3 doses as needed for chest pain. Patient not taking: Reported on 03/24/2015 03/22/15   Abelino DerrickLuke K Kilroy, PA-C  pantoprazole (PROTONIX) 40 MG tablet Take 1 tablet (40 mg total) by mouth daily at 12 noon. 03/22/15   Abelino DerrickLuke K Kilroy, PA-C    Anti-infectives    None      Scheduled Meds: . aspirin EC  81 mg Oral q morning - 10a  . atorvastatin  40 mg Oral QPM  . clopidogrel  75 mg Oral Q breakfast  . insulin aspart  5 Units Subcutaneous TID WC  . insulin detemir  50 Units Subcutaneous Q12H  . isosorbide mononitrate  60 mg Oral q morning - 10a  . lisinopril  40 mg Oral q morning - 10a  . metFORMIN  1,000 mg Oral BID WC  . metoprolol succinate  50 mg Oral Daily  . nicotine  21 mg Transdermal Daily  . pantoprazole  40 mg Oral Q1200  . sertraline  25 mg Oral Daily  . spironolactone  25 mg Oral Daily  . tuberculin  5 Units Intradermal Once   Continuous Infusions:  PRN Meds:.clonazePAM, hydrOXYzine, ibuprofen, nitroGLYCERIN, zolpidem  Allergies  Allergen Reactions  . Shellfish Allergy Swelling    Physical Exam  Vitals  Blood pressure 129/77, pulse 75, temperature 98.1 F (36.7 C), temperature source Oral, resp. rate 18, height 5\' 11"  (1.803 m), weight 109.77 kg (242 lb), SpO2 99 %.   1. General ambulating in hall, NAD,   2. Depressed, mood labile, tearful at times,  Awake Alert, Oriented X 3.  3. No F.N deficits, ALL C.Nerves Intact, Strength 5/5 all 4 extremities, Sensation intact all 4 extremities, Plantars down going.  4. Ears and Eyes appear Normal, Conjunctivae clear, PERRLA. Moist Oral Mucosa.  5. Supple Neck, No JVD, No cervical lymphadenopathy appriciated, No Carotid Bruits.  6. Symmetrical Chest wall movement, Good air movement bilaterally, CTAB.  7. RRR, No Gallops, Rubs or Murmurs, No Parasternal Heave.  8. Positive Bowel Sounds, Abdomen Soft, No tenderness, No organomegaly appriciated,No rebound -guarding or rigidity.  9.   No Cyanosis, Normal Skin Turgor, No Skin Rash or Bruise.  10. Good muscle tone,  joints appear normal , no effusions, Normal ROM.  11. No Palpable Lymph Nodes in Neck or Axillae    Data Review  CBC  Recent Labs Lab 03/22/15 0334 03/22/15 2310 03/22/15 2316 03/24/15 1436  WBC 3.4* 5.6  --  7.8  HGB 12.9* 14.1 14.6 15.7  HCT 37.9* 39.8 43.0 44.5  PLT 190 241  --  325  MCV 93.6 92.6  --  94.1  MCH 31.9 32.8  --  33.2  MCHC 34.0 35.4  --  35.3  RDW 14.6 14.6  --  14.8  LYMPHSABS  --  1.1  --   --   MONOABS  --  0.5  --   --   EOSABS  --  0.1  --   --   BASOSABS  --  0.0  --   --    ------------------------------------------------------------------------------------------------------------------  Chemistries   Recent Labs Lab 03/22/15 0334 03/22/15 2316 03/24/15 1436  NA 135 135 137  K 3.7 3.9 3.9  CL 102 99* 104  CO2 22  --  19*  GLUCOSE 148* 183* 195*  BUN CREATININE 0.64 0.60* 0.75  CALCIUM 8.4*  --  9.7  AST  --   --  30  ALT  --   --  41  ALKPHOS  --   --  54  BILITOT  --   --  1.0   ------------------------------------------------------------------------------------------------------------------ estimated creatinine clearance is 139.2 mL/min (by C-G formula based on Cr of 0.75). ------------------------------------------------------------------------------------------------------------------ No results for input(s): TSH, T4TOTAL, T3FREE, THYROIDAB in the last 72 hours.  Invalid input(s): FREET3   Coagulation profile No results for input(s): INR, PROTIME in the last 168 hours. ------------------------------------------------------------------------------------------------------------------- No results for input(s): DDIMER in the last 72 hours. -------------------------------------------------------------------------------------------------------------------  Cardiac Enzymes No results for input(s): CKMB, TROPONINI, MYOGLOBIN in the last  168 hours.  Invalid input(s): CK ------------------------------------------------------------------------------------------------------------------ Invalid input(s): POCBNP   ---------------------------------------------------------------------------------------------------------------  Urinalysis No results found for: COLORURINE, APPEARANCEUR, LABSPEC, PHURINE, GLUCOSEU, HGBUR, BILIRUBINUR, KETONESUR, PROTEINUR, UROBILINOGEN, NITRITE, LEUKOCYTESUR   Imaging results:   No results found.  My personal review of EKG: Rhythm NSR, QTc 478 , no Acute ST changes, chronic LBBB.    Assessment & Plan  Principal Problem:   Panic disorder without agoraphobia with severe panic attacks Active Problems:   Major depressive disorder, recurrent severe without psychotic features   Sedative, hypnotic or anxiolytic dependence   Alcohol use disorder, moderate, dependence    1.  EKG changes: does has chronic LBBB, borderline QTc prolongation. compare to prior EKG,no acute changes. Patient currently no chest pain, no sob, review chart patient just has cardiac catheterization on 5/23, showed patent stent, recommended medical management.  2. Patient reported chronic palpitation, review all past EKG and tele strips, all with sinus rhythm. tsh wnl. Palpitation likely related to anxiety, but will recommend outpatient holter monitor.  3. Anxiety/depression per behavioral health.  Plan of care discussed with physician assistant Vernona Rieger in person.  Thank you for allowing hospitalist service  to participate in the care of your patient, we will sign off, please call if question.      Family Communication: Plan discussed with patient    Anorah Trias MD, PhD on 03/28/2015 at 5:05 PM  Between 7am to 7pm - Pager - [phone]  After 7pm go to [email or web address] - password [facility]    [facility] Office  [phone]

## 2015-03-28 NOTE — [facility] Group Notes (Signed)
   [facility]  03/28/2015  8:45 AM   Participation Quality: Alert, Appropriate and Oriented  Mood/Affect: Depressed and Flat  Depression Rating: Patient reports experiencing high depressive symptoms today  Anxiety Rating: 8  Thoughts of Suicide: Pt denies SI/HI  Will you contract for safety? Yes  Current AVH: Pt denies  Plan for Discharge/Comments: Pt attended discharge planning group and actively participated in group. CSW provided pt with today's workbook. Patient reports feeling "anxious" today. [place] from [name], has no income or transportation, and has a court date on July 5th. He denies interest in staying in a local shelter, reports interest [name].   Transportation Means: Patient denies access to transportation  Supports: No supports mentioned at this time  [name], MSW, [employer] Clinical Social Worker [facility] [phone]

## 2015-03-28 NOTE — Progress Notes (Signed)
Patient has been agitated, irritated today, concerning about where he will be living after discharge.  Denied SI and HI, contracts for safety.  Denied A/V hallucinations.  Patient was given PPD left forearm on 03/28/2015 at 1430.  EKG completed.  Zoloft given after EKG reviewed by MD.  No roommate order.  Respirations even and unlabored.  No signs/symptoms of pain/distress noted on patient's face/body movements.  Safety maintained with 15 minute checks.

## 2015-03-28 NOTE — Progress Notes (Addendum)
[facility]  03/28/2015 3:21 PM Elmon KirschnerDavid Mustard  MRN:  [id number]   Subjective:  Patient states " I want to have access to a computer , so that I can start working on my application to the recovery place. I also wamt my HIV and TB test and I have to fax it to them. I do not want to be on Prozac any more , it is not doing anything for me. I ma a very aggressive person, I cannot control myself sometimes.I was having a lot racing heart rate , my doctor told me that I have anxiety issues."   Objective: Patient seen and chart reviewed.Discussed patient with treatment team.  Patient is a [age] y old CM who presented with SI , as well as severe anxiety sx. Pt was recently seen by cardiology , who recommended management of anxiety sx which could be contributing to his palpitations. Pt today continues to be anxious , irritable , states he want to go to this recovery place to get help . Pt states Prozac as not working for his anxiety,depression, discussed Zoloft - he is willing to take it. Pt with improved sleep. Denies any SI/AH/VH today.Pt continues to be HI today , to everyone - no specific person- states he is a very aggressive person.   Per staff patient continues to be somatic , medication seeking and irritable.   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 Alcohol use disorder, moderate, dependence [F10.20] 03/28/2015  . 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 Generalized anxiety disorder [F41.1] 03/25/2015  . 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 LBBB  [I44.7] 03/22/2015  . ICM- EF 35-40% at cath 03/21/15 [I25.5] 03/22/2015  . Palpitations [R00.2]   . Non-STEMI- Troponin 0.23 [I21.4] 03/20/2015  . CAD S/P multiple prior  PCIs, cath 02/2315- all patent [I25.10, Z98.61]   . Hypertension [I10]   . Diabetes mellitus without complication [E11.9]   . Obesity (BMI 30-39.9) [E66.9]   . Hyperlipidemia [E78.5]    Total Time spent with patient: 30 minutes   Past Medical History:  Past Medical History  Diagnosis Date  . Coronary artery disease   . Hypertension   . Diabetes mellitus without complication   . Obesity (BMI 30-39.9)   . Hyperlipidemia     Past Surgical History  Procedure Laterality Date  . Coronary angioplasty with stent placement    . Laparoscopic cholecystectomy    . Cardiac catheterization N/A 03/21/2015    Procedure: Left Heart Cath and Coronary Angiography;  Surgeon: Lyn RecordsHenry W Smith, MD;  [facility];  Service: Cardiovascular;  Laterality: N/A;   Family History: History reviewed. No pertinent family history. Social History:  History  Alcohol Use No     History  Drug Use No    History   Social History  . Marital Status: Married    Spouse Name: N/A  . Number of Children: N/A  . Years of Education: N/A   Social History Main Topics  . Smoking status: Former Smoker -- 1.00 packs/day for 30 years    Types: Cigarettes  . Smokeless tobacco: Never Used  . Alcohol Use: No  . Drug Use: No  . Sexual Activity: Not on file   Other Topics Concern  . None   Social  History Narrative   Truck driver      Additional History:    Sleep: Fair  Appetite:  Fair    Musculoskeletal: Strength & Muscle Tone: within normal limits Gait & Station: normal Patient leans: N/A   Psychiatric Specialty Exam: Physical Exam  Nursing note and vitals reviewed. Constitutional: He is oriented to person, place, and time.  Neck: Normal range of motion.  Respiratory: Effort normal and breath sounds normal.  Neurological: He is alert and oriented to person, place, and time.  Psychiatric: His speech is normal. His mood appears anxious. His affect is angry. He is agitated and combative.  Cognition and memory are normal. He exhibits a depressed mood. He expresses suicidal ideation.    Review of Systems  Psychiatric/Behavioral: Positive for depression. The patient is nervous/anxious and has insomnia.   All other systems reviewed and are negative.   Blood pressure 129/77, pulse 75, temperature 98.1 F (36.7 C), temperature source Oral, resp. rate 18, height  (1.803 m), weight 109.77 kg (242 lb), SpO2 99 %.Body mass index is 33.77 kg/(m^2).  General Appearance: Casual  Eye Contact::  Fair  Speech:  Normal Rate  Volume:  Normal  Mood:  Anxious, Depressed and Irritable  Affect:  Depressed  Thought Process:  Circumstantial  Orientation:  Full (Time, Place, and Person)  Thought Content:  Paranoid Ideation and Rumination  Suicidal Thoughts:  No  Homicidal Thoughts:  Yes.  without intent/plan  Memory:  Immediate;   Good Recent;   Good Remote;   Good  Judgement:  Poor  Insight:  Lacking  Psychomotor Activity:  Normal  Concentration:  Fair  Recall:  Good  Fund of Knowledge:Fair  Language: Good  Akathisia:  Negative  Handed:  Right  AIMS (if indicated):     Assets:  Communication Skills  ADL's:  Intact  Cognition: WNL  Sleep:  Number of Hours: 3     Current Medications: Current Facility-Administered Medications  Medication Dose Route Frequency Provider Last Rate Last Dose  . aspirin EC tablet 81 mg  81 mg Oral q morning - 10a Earney Navy, NP   81 mg at 03/28/15 1032  . atorvastatin (LIPITOR) tablet 40 mg  40 mg Oral QPM Earney Navy, NP   40 mg at 03/27/15 1731  . clonazePAM (KLONOPIN) tablet 0.5 mg  0.5 mg Oral TID PRN Jomarie Longs, MD      . clopidogrel (PLAVIX) tablet 75 mg  75 mg Oral Q breakfast Earney Navy, NP   75 mg at 03/28/15 0737  . hydrOXYzine (ATARAX/VISTARIL) tablet 50 mg  50 mg Oral TID PRN Worthy Flank, NP   50 mg at 03/25/15 2214  . ibuprofen (ADVIL,MOTRIN) tablet 600 mg  600 mg Oral Q6H PRN Adonis Brook, NP   600 mg  at 03/27/15 1930  . insulin aspart (novoLOG) injection 5 Units  5 Units Subcutaneous TID WC Earney Navy, NP   5 Units at 03/28/15 1204  . insulin detemir (LEVEMIR) injection 50 Units  50 Units Subcutaneous Q12H Earney Navy, NP   50 Units at 03/28/15 0736  . isosorbide mononitrate (IMDUR) 24 hr tablet 60 mg  60 mg Oral q morning - 10a Earney Navy, NP   60 mg at 03/28/15 1034  . lisinopril (PRINIVIL,ZESTRIL) tablet 40 mg  40 mg Oral q morning - 10a Earney Navy, NP   40 mg at 03/28/15 1033  . metFORMIN (GLUCOPHAGE) tablet 1,000 mg  1,000 mg Oral  BID WC Earney Navy, NP   1,000 mg at 03/28/15 0738  . metoprolol succinate (TOPROL-XL) 24 hr tablet 50 mg  50 mg Oral Daily Earney Navy, NP   50 mg at 03/28/15 0738  . nicotine (NICODERM CQ - dosed in mg/24 hours) patch 21 mg  21 mg Transdermal Daily Earney Navy, NP   21 mg at 03/28/15 0700  . nitroGLYCERIN (NITROSTAT) SL tablet 0.4 mg  0.4 mg Sublingual Q5 Min x 3 PRN Earney Navy, NP      . pantoprazole (PROTONIX) EC tablet 40 mg  40 mg Oral Q1200 Earney Navy, NP   40 mg at 03/28/15 1206  . sertraline (ZOLOFT) tablet 25 mg  25 mg Oral Daily Rasheeda Mulvehill, MD   25 mg at 03/28/15 1443  . spironolactone (ALDACTONE) tablet 25 mg  25 mg Oral Daily Earney Navy, NP   25 mg at 03/28/15 0739  . tuberculin injection 5 Units  5 Units Intradermal Once Jomarie Longs, MD   5 Units at 03/28/15 1423  . zolpidem (AMBIEN) tablet 10 mg  10 mg Oral QHS PRN Rachael Fee, MD   10 mg at 03/27/15 2128    Lab Results:  Results for orders placed or performed during the hospital encounter of 03/25/15 (from the past 48 hour(s))  Glucose, capillary     Status: Abnormal   Collection Time: 03/26/15  4:36 PM  Result Value Ref Range   Glucose-Capillary 135 (H) 65 - 99 mg/dL  Glucose, capillary     Status: Abnormal   Collection Time: 03/26/15  8:51 PM  Result Value Ref Range   Glucose-Capillary 137 (H) 65 - 99  mg/dL  Glucose, capillary     Status: Abnormal   Collection Time: 03/27/15  6:06 AM  Result Value Ref Range   Glucose-Capillary 119 (H) 65 - 99 mg/dL   Comment 1 Notify RN    Comment 2 Document in Chart   Glucose, capillary     Status: Abnormal   Collection Time: 03/27/15 11:37 AM  Result Value Ref Range   Glucose-Capillary 136 (H) 65 - 99 mg/dL   Comment 1 Notify RN    Comment 2 Document in Chart   Glucose, capillary     Status: Abnormal   Collection Time: 03/27/15  5:12 PM  Result Value Ref Range   Glucose-Capillary 167 (H) 65 - 99 mg/dL   Comment 1 Document in Chart   Glucose, capillary     Status: Abnormal   Collection Time: 03/27/15  9:06 PM  Result Value Ref Range   Glucose-Capillary 103 (H) 65 - 99 mg/dL   Comment 1 Notify RN   Glucose, capillary     Status: Abnormal   Collection Time: 03/28/15  6:21 AM  Result Value Ref Range   Glucose-Capillary 108 (H) 65 - 99 mg/dL  Glucose, capillary     Status: None   Collection Time: 03/28/15 12:02 PM  Result Value Ref Range   Glucose-Capillary 96 65 - 99 mg/dL   Comment 1 Notify RN    Comment 2 Document in Chart     Physical Findings: AIMS:  , ,  ,  ,    CIWA:    COWS:      Assessment: Patient is a 35 y old CM who presented IVCed for SI , continues to be anxious, irritable as well as depressed. Pt will continue to need medication management.     Treatment Plan Summary: Daily  contact with patient to assess and evaluate symptoms and progress in treatment and Medication management Will start a trial of Zoloft 25 mg po daily for affective sx. Will DC Prozac for lack of efficacy and pt prefers to be off of it. Will change ATIVAN TO - Klonopin  0.5 mg po tid prn for anxiety sx. Will start CIWA - pt states abusing alcohol, binging on it , on and off. Pt would like to be referred to a substance abuse program.Pt initially stated his last drink was 2 months ago , then stated it was last Saturday. Pt does not seem to be  forthcoming about his abuse hx. BAL <5 -03/24/15. Restart home medications as scheduled. Pt with extensive cardiac hx as well as DM.  EKG reviewed - pt with ST- T wave changes - Vernona Rieger NP will discuss this with hospitalist. Will order PPD,HIV test- pt gave consent. CSW will work on disposition.      Medical Decision Making:  Review of Psycho-Social Stressors (1), Review or order clinical lab tests (1), Review of Last Therapy Session (1), Independent Review of image, tracing or specimen (2), Review of Medication Regimen & Side Effects (2) and Review of New Medication or Change in Dosage (2)   Katheryn Culliton, MD 03/28/2015, 3:21 PM

## 2015-03-28 NOTE — Clinical Social Work Note (Signed)
Patient interested in [facility] [place] [phone]. CSW spoke with program staff member Harrold Donathathan who reports that patient will need to call to complete application over the phone. He will also need to have HIV/TB lab results faxed over. There is a 3 week approximate wait list for program and $250 charge. CSW informed patient about eligibility requirements. Patient agreeable to contact program to complete application. CSW will continue to follow.  Samuella BruinKristin Carolin Quang, MSW, [employer] Clinical Social Worker [facility] [phone]

## 2015-03-28 NOTE — Progress Notes (Signed)
Pt attended wrap-up group, when asked what his goal was pt stated he did have a goal for the day.  Pt stated that he had a better day today than yesterday d/t getting restful sleep last night.   Tomi BambergerMariya Tatianna Ibbotson, MHT

## 2015-03-28 NOTE — Progress Notes (Signed)
PPD PLACED LEFT FOREARM ON 03/28/2015 AT 1430.  PPD TO BE READ ON March 30, 2015 AT 1430.   EKG COMPLETED AND REVIEWED BY MD.

## 2015-03-28 NOTE — Plan of Care (Signed)
Problem: Alteration in mood; excessive anxiety as evidenced by: Goal: LTG-Patient's behavior demonstrates decreased anxiety (Patient's behavior demonstrates anxiety and he/she is utilizing learned coping skills to deal with anxiety-producing situations)  Outcome: Not Progressing Patient's behavior has not demonstrated decreased anxiety, he requires ativan several times during the day to maintain his increased anxiety.

## 2015-03-29 LAB — GLUCOSE, CAPILLARY
GLUCOSE-CAPILLARY: 161 mg/dL — AB (ref 65–99)
Glucose-Capillary: 138 mg/dL — ABNORMAL HIGH (ref 65–99)
Glucose-Capillary: 145 mg/dL — ABNORMAL HIGH (ref 65–99)

## 2015-03-29 LAB — HIV ANTIBODY (ROUTINE TESTING W REFLEX): HIV Screen 4th Generation wRfx: NONREACTIVE

## 2015-03-29 MED ORDER — SERTRALINE HCL 50 MG PO TABS
50.0000 mg | ORAL_TABLET | Freq: Every day | ORAL | Status: DC
  Administered 2015-03-30 – 2015-03-31 (×2): 50 mg via ORAL
  Filled 2015-03-29 (×3): qty 1

## 2015-03-29 NOTE — Plan of Care (Signed)
Problem: Consults Goal: Depression Patient Education See Patient Education Module for education specifics.  Outcome: Completed/Met Date Met:  03/29/15 Nurse discussed depression/coping skills with patient.

## 2015-03-29 NOTE — Progress Notes (Signed)
Recreation Therapy Notes  Animal-Assisted Activity (AAA) Program Checklist/Progress Notes Patient Eligibility Criteria Checklist & Daily Group note for Rec Tx Intervention  Date: 05.31.16 Time: 2:30pm [facility]  AAA/T Program Assumption of Risk Form signed by Patient/ or Parent Legal Guardian yes  Patient is free of allergies or sever asthma yes  Patient reports no fear of animals yes  Patient reports no history of cruelty to animals yes  Patient understands his/her participation is voluntary yes  Patient washes hands before animal contact yes  Patient washes hands after animal contact yes  Education: Hand Washing, Appropriate Animal Interaction   Education Outcome: Acknowledges understanding/In group clarification offered/Needs additional education.   Clinical Observations/Feedback: Patient did not attend group.   Caroll RancherMarjette Adeleine Pask, LRT/CTRS         Caroll RancherLindsay, Attilio Zeitler A 03/29/2015 4:30 PM

## 2015-03-29 NOTE — Progress Notes (Signed)
Pt attended spiritual care group on grief and loss facilitated by chaplain Geza Beranek. Group opened with brief discussion and psycho-social ed around grief and loss in relationships and in relation to self - identifying life patterns, circumstances, changes that cause losses. Established group norm of speaking from own life experience. Group goal of establishing open and affirming space for members to share loss and experience with grief, normalize grief experience and provide psycho social education and grief support.  Group drew on narrative and Alderian therapeutic modalities.      

## 2015-03-29 NOTE — Tx Team (Signed)
Interdisciplinary Treatment Plan Update (Adult)  Date:  03/29/2015  Time Reviewed:  12:38 PM   Progress in Treatment: Attending groups: Patient is attending groups. Participating in groups:  Patient engages in discussion Taking medication as prescribed:  Patient is taking medications Tolerating medication:  Patient is tolerating medications Family/Significant othe contact made:   No, patient declined collateral contact Patient understands diagnosis:Yes, patient understands diagnosis and need for treatment Discussing patient identified problems/goals with staff:  Yes, patient is able to express goals/problems Medical problems stabilized or resolved:  Yes Denies suicidal/homicidal ideation: Yes, patient is denying SI/HI. Issues/concerns per patient self-inventory:   Other:  Discharge Plan or Barriers:  [facility] [place]  Reason for Continuation of Hospitalization: Anxiety Depression Medication stabilization  Comments:  Additional comments:  Patient and CSW reviewed Patient Discharge Process Letter/Patient Involvement Form.  Patient verbalized understanding and signed form.  Patient and CSW also reviewed and identified patient's goals and treatment plan.  Patient verbalized understanding and agreed to plan.  Estimated length of stay:  3-4 days  New goal(s):  Review of initial/current patient goals per problem list:  Please see plan of careInterdisciplinary Treatment Plan Update (Adult)  Attendees: Patient 03/29/2015 12:38 PM   Family:   03/29/2015 12:38 PM   Physician:  Nehemiah MassedFernando Cobos, MD 03/29/2015 12:38 PM   Nursing:   Vivi FernsAshley Strader, RN 03/29/2015 12:38 PM   Clinical Social Worker:  Juline PatchQuylle Marcques Wrightsman, LCSW 03/29/2015 12:38 PM   Clinical Social Worker:  Belenda CruiseKristin Drinkard, LCSW-A 03/29/2015 12:38 PM   Case Manager:  Onnie BoerJennifer Clark, RN 03/29/2015 12:38 PM   Other:  Quintella ReichertBeverly Knight, RN 03/29/2015 12:38 PM  Other:   03/29/2015  12:38 PM   Other:   03/29/2015 12:38 PM   Other:  03/29/2015 12:38 PM   Other:  03/29/2015 12:38 PM   Other:  Chad CordialValerie Enoch, [facility] Transition Team Coordinator 03/29/2015 12:38 PM   Other:   03/29/2015 12:38 PM   Other:  03/29/2015 12:38 PM   Other:   03/29/2015 12:38 PM    Scribe for Treatment Team:   Wynn BankerHodnett, Farrell Broerman Hairston, 03/29/2015   12:38 PM

## 2015-03-29 NOTE — [facility] Group Notes (Signed)
[facility] LCSW Group Therapy  03/29/2015 3:51 PM  Type of Therapy:  Group Therapy  Participation Level:  Did Not Attend - patient was invited to group but did not attend.   Wynn BankerHodnett, Jake Mayo 03/29/2015    3:51 PM

## 2015-03-29 NOTE — Progress Notes (Signed)
D:  Patient denied SI and HI this morning while talking to nurse.  Denied A/V hallucinations.  Contracts for safety.  Denied pain.  Stated he did feel alittle nauseated.  Patient given diet ginger ale per his request. A:  Medications administered to patient.  Emotional support and encouragement given patient. R:  Safety maintained with 15 minute checks.   Patient given klonipin this morning for anxiety.  Is concerned about his living and treatment arrangements [facility].

## 2015-03-29 NOTE — Progress Notes (Addendum)
Patient ID: Jake Mayo, male   DOB: May 13, 1966, 49 y.o.   MRN: [id number] [facility] MD Progress Note  03/29/2015 2:30 PM Jake Mayo  MRN:  [id number]   Subjective:  Patient reports he has been feeling depressed, sad. He says he is sleeping better than before admission and feels this is a major improvement, as insomnia had been significant prior to admission. Today he reports some abdominal discomfort, vague nausea,  Lose stools, but no vomiting.  Objective: Patient seen and chart reviewed.Discussed patient with treatment team.  Patient is a 49 year old man, who has a history of depression, anxiety ( both worrying and frequent  panic attacks ) , at least partially related to psychosocial stressors ( recent break up, facing legal charges)  Medical History is remarkable for CAD, history of DM.  He has a history of alcohol abuse, at this time no significant tremors, no diaphoresis, no agitation, vitals stable . Patient reports ongoing sense of depression, anxiety, but states there has been some improvement and in particular identifies improved sleep. He was started on Zoloft yesterday, replacing Prozac, which he felt was not working well for him. Some group participation , but today has been more isolative. Of note, denies any suicidal ideations at present and contracts for safety on the unit . No disruptive behaviors on unit, but does have some irritability. HIV Negative     Principal Problem: Panic disorder without agoraphobia with severe panic attacks Diagnosis:   Patient Active Problem List   Diagnosis Date Noted  . 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 Alcohol use disorder, moderate, dependence [F10.20] 03/28/2015  . 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 Generalized anxiety disorder [F41.1] 03/25/2015  . 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 LBBB  [I44.7]  03/22/2015  . ICM- EF 35-40% at cath 03/21/15 [I25.5] 03/22/2015  . Palpitations [R00.2]   . Non-STEMI- Troponin 0.23 [I21.4] 03/20/2015  . CAD S/P multiple prior PCIs, cath 02/2315- all patent [I25.10, Z98.61]   . Hypertension [I10]   . Diabetes mellitus without complication [E11.9]   . Obesity (BMI 30-39.9) [E66.9]   . Hyperlipidemia [E78.5]    Total Time spent with patient: 20 minutes   Past Medical History:  Past Medical History  Diagnosis Date  . Coronary artery disease   . Hypertension   . Diabetes mellitus without complication   . Obesity (BMI 30-39.9)   . Hyperlipidemia     Past Surgical History  Procedure Laterality Date  . Coronary angioplasty with stent placement    . Laparoscopic cholecystectomy    . Cardiac catheterization N/A 03/21/2015    Procedure: Left Heart Cath and Coronary Angiography;  Surgeon: Lyn Records, MD;  Location: [facility] INVASIVE CV LAB;  Service: Cardiovascular;  Laterality: N/A;   Family History: History reviewed. No pertinent family history. Social History:  History  Alcohol Use No     History  Drug Use No    History   Social History  . Marital Status: Married    Spouse Name: N/A  . Number of Children: N/A  . Years of Education: N/A   Social History Main Topics  . Smoking status: Former Smoker -- 1.00 packs/day for 30 years    Types: Cigarettes  . Smokeless tobacco: Never Used  . Alcohol Use: No  . Drug Use: No  . Sexual Activity: Not on file   Other Topics Concern  . None   Social  History Narrative   Truck driver      Additional History:    Sleep: improved   Appetite:  Fair    Musculoskeletal: Strength & Muscle Tone: within normal limits Gait & Station: normal Patient leans: N/A   Psychiatric Specialty Exam: Physical Exam  Nursing note and vitals reviewed. Constitutional: He is oriented to person, place, and time.  Neck: Normal range of motion.  Respiratory: Effort normal and breath sounds normal.   Neurological: He is alert and oriented to person, place, and time.  Psychiatric: His speech is normal. His mood appears anxious. His affect is angry. He is agitated and combative. Cognition and memory are normal. He exhibits a depressed mood. He expresses suicidal ideation.    ROS- today reports abdominal discomfort, some nausea, lose stools, no vomiting and was able to have lunch.  Denies chest pain or SOB.  Blood pressure 141/87, pulse 81, temperature 98.7 F (37.1 C), temperature source Oral, resp. rate 18, height 5\' 11"  (1.803 m), weight 242 lb (109.77 kg), SpO2 99 %.Body mass index is 33.77 kg/(m^2).  General Appearance: Casual  Eye Contact::  Fair  Speech:  Normal Rate  Volume:  Normal  Mood:  Depressed   Affect:   Constricted and mildly irritable   Thought Process:  Linear   Orientation:  Full (Time, Place, and Person)  Thought Content:  Denies hallucinations, no delusions expressed , ruminative about stressors   Suicidal Thoughts:  No  Homicidal Thoughts:  today denies any HI  Memory:  Immediate;   Good Recent;   Good Remote;   Good  Judgement:  Fair  Insight:  Fair  Psychomotor Activity:  Decreased  Concentration:  Fair  Recall:  Good  Fund of Knowledge:Fair  Language: Good  Akathisia:  Negative  Handed:  Right  AIMS (if indicated):     Assets:  Communication Skills  ADL's:  Intact  Cognition: WNL  Sleep:  Number of Hours: 6.25     Current Medications: Current Facility-Administered Medications  Medication Dose Route Frequency Provider Last Rate Last Dose  . aspirin EC tablet 81 mg  81 mg Oral q morning - 10a Earney NavyJosephine C Onuoha, NP   81 mg at 03/29/15 0925  . atorvastatin (LIPITOR) tablet 40 mg  40 mg Oral QPM Earney NavyJosephine C Onuoha, NP   40 mg at 03/28/15 1701  . clonazePAM (KLONOPIN) tablet 0.5 mg  0.5 mg Oral TID PRN Jomarie LongsSaramma Eappen, MD   0.5 mg at 03/29/15 0928  . clopidogrel (PLAVIX) tablet 75 mg  75 mg Oral Q breakfast Earney NavyJosephine C Onuoha, NP   75 mg at 03/29/15  0803  . hydrOXYzine (ATARAX/VISTARIL) tablet 50 mg  50 mg Oral TID PRN Worthy FlankIjeoma E Nwaeze, NP   50 mg at 03/28/15 2104  . ibuprofen (ADVIL,MOTRIN) tablet 600 mg  600 mg Oral Q6H PRN Adonis BrookSheila Agustin, NP   600 mg at 03/27/15 1930  . insulin aspart (novoLOG) injection 5 Units  5 Units Subcutaneous TID WC Earney NavyJosephine C Onuoha, NP   5 Units at 03/29/15 1255  . insulin detemir (LEVEMIR) injection 50 Units  50 Units Subcutaneous Q12H Earney NavyJosephine C Onuoha, NP   50 Units at 03/29/15 0757  . isosorbide mononitrate (IMDUR) 24 hr tablet 60 mg  60 mg Oral q morning - 10a Earney NavyJosephine C Onuoha, NP   60 mg at 03/29/15 0926  . lisinopril (PRINIVIL,ZESTRIL) tablet 40 mg  40 mg Oral q morning - 10a Earney NavyJosephine C Onuoha, NP   40 mg at 03/29/15  1610  . metFORMIN (GLUCOPHAGE) tablet 1,000 mg  1,000 mg Oral BID WC Earney Navy, NP   1,000 mg at 03/29/15 0803  . metoprolol succinate (TOPROL-XL) 24 hr tablet 50 mg  50 mg Oral Daily Earney Navy, NP   50 mg at 03/29/15 0807  . nicotine (NICODERM CQ - dosed in mg/24 hours) patch 21 mg  21 mg Transdermal Daily Earney Navy, NP   21 mg at 03/29/15 0804  . nitroGLYCERIN (NITROSTAT) SL tablet 0.4 mg  0.4 mg Sublingual Q5 Min x 3 PRN Earney Navy, NP      . pantoprazole (PROTONIX) EC tablet 40 mg  40 mg Oral Q1200 Earney Navy, NP   40 mg at 03/29/15 1256  . sertraline (ZOLOFT) tablet 25 mg  25 mg Oral Daily Jomarie Longs, MD   25 mg at 03/29/15 0806  . spironolactone (ALDACTONE) tablet 25 mg  25 mg Oral Daily Earney Navy, NP   25 mg at 03/29/15 0804  . tuberculin injection 5 Units  5 Units Intradermal Once Jomarie Longs, MD   5 Units at 03/28/15 1423  . zolpidem (AMBIEN) tablet 10 mg  10 mg Oral QHS PRN Rachael Fee, MD   10 mg at 03/28/15 2104    Lab Results:  Results for orders placed or performed during the hospital encounter of 03/25/15 (from the past 48 hour(s))  Glucose, capillary     Status: Abnormal   Collection Time: 03/27/15  5:12 PM   Result Value Ref Range   Glucose-Capillary 167 (H) 65 - 99 mg/dL   Comment 1 Document in Chart   Glucose, capillary     Status: Abnormal   Collection Time: 03/27/15  9:06 PM  Result Value Ref Range   Glucose-Capillary 103 (H) 65 - 99 mg/dL   Comment 1 Notify RN   Glucose, capillary     Status: Abnormal   Collection Time: 03/28/15  6:21 AM  Result Value Ref Range   Glucose-Capillary 108 (H) 65 - 99 mg/dL  Glucose, capillary     Status: None   Collection Time: 03/28/15 12:02 PM  Result Value Ref Range   Glucose-Capillary 96 65 - 99 mg/dL   Comment 1 Notify RN    Comment 2 Document in Chart   Glucose, capillary     Status: Abnormal   Collection Time: 03/28/15  4:49 PM  Result Value Ref Range   Glucose-Capillary 132 (H) 65 - 99 mg/dL   Comment 1 Notify RN   HIV antibody     Status: None   Collection Time: 03/28/15  7:20 PM  Result Value Ref Range   HIV Screen 4th Generation wRfx Non Reactive Non Reactive    Comment: (NOTE) Performed At: [facility] [address] [place], [place] [id number] Mila Homer MD JX:[id number] Performed at [facility]   Glucose, capillary     Status: Abnormal   Collection Time: 03/29/15  6:16 AM  Result Value Ref Range   Glucose-Capillary 138 (H) 65 - 99 mg/dL  Glucose, capillary     Status: Abnormal   Collection Time: 03/29/15 12:11 PM  Result Value Ref Range   Glucose-Capillary 161 (H) 65 - 99 mg/dL   Comment 1 Notify RN    Comment 2 Document in Chart     Physical Findings: AIMS: Facial and Oral Movements Muscles of Facial Expression: None, normal Lips and Perioral Area: None, normal Jaw: None, normal Tongue: None, normal,Extremity Movements Upper (  arms, wrists, hands, fingers): None, normal Lower (legs, knees, ankles, toes): None, normal, Trunk Movements Neck, shoulders, hips: None, normal, Overall Severity Severity of abnormal movements (highest score from questions above): None, normal Incapacitation  due to abnormal movements: None, normal Patient's awareness of abnormal movements (rate only patient's report): No Awareness, Dental Status Current problems with teeth and/or dentures?: No Does patient usually wear dentures?: No  CIWA:  CIWA-Ar Total: 1 COWS:  COWS Total Score: 2   Assessment:  Patient is a 49 year old male, who presents with depression and anxiety. He has chronic medical illnesses ( CAD, DM) and significant psychosocial stressors ( legal, interpersonal conflict). On zoloft since yesterday. Today has vague abdominal symptoms which may be related to Zoloft . We discussed and agreed to continue Zoloft for now, and if GI symptoms persist or worsen consider switching to another medication. Patient's insomnia much improved with Ambien, which he states he is tolerating well.       Treatment Plan Summary: Daily contact with patient to assess and evaluate symptoms and progress in treatment and Medication management Continue Zoloft 50 mgrs QDAY for depression, but monitor for GI side effects Continue Klonopin  0.5 mg po tid prn for  Anxiety  Continue Ambien 10 mgrs QHS for Insomnia. On CIWA protocol  to minimize risk of alcohol WDL On Imdur, Aldactone, Toprol, Lisinopril, Plavix , ASA for history of CAD. On Levemir Novolog Insulin for history of DM.  ( these are reported as patient's home medications )  Patient interested in going to a Rehab setting after discharge.       Medical Decision Making:  Review of Psycho-Social Stressors (1), Review or order clinical lab tests (1), Review of Last Therapy Session (1), Independent Review of image, tracing or specimen (2), Review of Medication Regimen & Side Effects (2) and Review of New Medication or Change in Dosage (2)   Nehemiah Massed, MD 03/29/2015, 2:30 PM

## 2015-03-29 NOTE — [facility] Suicide Risk Assessment (Signed)
[facility] INPATIENT:  Family/Significant Other Suicide Prevention Education  Suicide Prevention Education:  Patient Refusal for Family/Significant Other Suicide Prevention Education: The patient Jake Mayo has refused to provide written consent for family/significant other to be provided Family/Significant Other Suicide Prevention Education during admission and/or prior to discharge.  Physician notified.  Jake Mayo 03/29/2015, 1:19 PM

## 2015-03-29 NOTE — Progress Notes (Signed)
D: Pt has anxious affect and mood.  Pt reports he has been "anxious all day long."  When asked why, pt reports he is worried about "what I'm going to do after here."  Pt reports his goal is to "work on this anxiety, quit worrying about my problems."  Pt denies SI/HI, denies hallucinations, denies pain.  Pt has been visible in milieu interacting with peers and staff appropriately.  Pt attended evening group.   A: Introduced self to pt.  Met with pt 1:1 and provided support and encouragement.  Actively listened to pt.  PRN medication administered for anxiety and sleep. R: Pt is compliant with medications.  Pt verbally contracts for safety.  Will continue to monitor and assess.

## 2015-03-29 NOTE — [facility] Group Notes (Signed)
The focus of this group is to educate the patient on the purpose and policies of crisis stabilization and provide a format to answer questions about their admission.  The group details unit policies and expectations of patients while admitted.  Patient did not attend 0900 nurse education orientation group this morning.  Patient stayed in bed.   

## 2015-03-29 NOTE — Progress Notes (Signed)
D: Pt has anxious affect and mood.  Pt reports his day has been "pretty good, boring."  Pt reports his goal today was "try to get my medications straight."  Pt denies SI/HI, denies hallucinations, denies pain.  Pt has been visible in milieu interacting with peers and staff appropriately.  Pt denies withdrawal symptoms.  Pt attended evening group.   A: Introduced self to pt.  Met with pt 1:1 and provided support and encouragement.  Actively listened to pt.  Medications administered per order.  PRN medication administered for anxiety and sleep. R: Pt is compliant with medications.  Pt verbally contracts for safety and reports that he will notify staff of needs and concerns.  Will continue to monitor and assess.

## 2015-03-29 NOTE — Plan of Care (Signed)
Problem: Alteration in mood Goal: LTG-Patient reports reduction in suicidal thoughts (Patient reports reduction in suicidal thoughts and is able to verbalize a safety plan for whenever patient is feeling suicidal)  Outcome: Progressing Pt denied SI this shift.  He verbally contracted for safety.       

## 2015-03-30 LAB — GLUCOSE, CAPILLARY
GLUCOSE-CAPILLARY: 134 mg/dL — AB (ref 65–99)
Glucose-Capillary: 133 mg/dL — ABNORMAL HIGH (ref 65–99)
Glucose-Capillary: 117 mg/dL — ABNORMAL HIGH (ref 65–99)
Glucose-Capillary: 113 mg/dL — ABNORMAL HIGH (ref 65–99)

## 2015-03-30 NOTE — Progress Notes (Signed)
Patient ID: Jake Mayo, male   DOB: 1966/05/07, 49 y.o.   MRN: [id number]  [facility] MD Progress Note  03/30/2015 3:16 PM Jake Mayo  MRN:  [id number]   Subjective: patient reports he is feeling " about the same " , bu acknowledges improvement compared to admission. He is reporting some residual insomnia, an ongoing sense of anxiety. He is future oriented and is interested in going to a Rehab setting , namely [facility], after discharge . At this time does not endorse medication side effects. Yesterday had reported some GI symptoms, but today states these symptoms improved/resolved.  Objective: Patient seen and chart reviewed.Discussed patient with treatment team.  On unit, patient calm, no disruptive behaviors , going to some groups. As noted, currently not endorsing medication side effects and nausea, abdominal discomfort he reported yesterday now resolved. States his appetite is "OK", denies diarrhea, nausea, or vomiting at this time. Reports sleep is fair, in spite of Ambien. States that insomnia has been long standing issue for him, but that Ambien helps. Reports he has been on Ambien for " years"- denies side effects. He is concerned about whether [facility] may not accept him on Ambien. Of note, as discussed with CSW, [facility] needs an MD assessment as to activities that patient can do- patient has a history of CAD /MI. I spoke with him about this- patient states that at this time he can do basic ADLs such as bathe/use bathroom/dress/undress/eat without any discomfort or chest pain. He can walk at a normal pace on the unit with no pain. States that exertion , exercise is impeded by shortness of breath, but not chest pain. Patient tolerating medications well and currently denies side effects. Mood and affect improved compared to admission. Anxious, particularly regarding disposition planning .      Principal Problem: Panic disorder without agoraphobia with severe panic  attacks Diagnosis:   Patient Active Problem List   Diagnosis Date Noted  . 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 Alcohol use disorder, moderate, dependence [F10.20] 03/28/2015  . 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 Generalized anxiety disorder [F41.1] 03/25/2015  . 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 LBBB  [I44.7] 03/22/2015  . ICM- EF 35-40% at cath 03/21/15 [I25.5] 03/22/2015  . Palpitations [R00.2]   . Non-STEMI- Troponin 0.23 [I21.4] 03/20/2015  . CAD S/P multiple prior PCIs, cath 02/2315- all patent [I25.10, Z98.61]   . Hypertension [I10]   . Diabetes mellitus without complication [E11.9]   . Obesity (BMI 30-39.9) [E66.9]   . Hyperlipidemia [E78.5]    Total Time spent with patient: 20 minutes   Past Medical History:  Past Medical History  Diagnosis Date  . Coronary artery disease   . Hypertension   . Diabetes mellitus without complication   . Obesity (BMI 30-39.9)   . Hyperlipidemia     Past Surgical History  Procedure Laterality Date  . Coronary angioplasty with stent placement    . Laparoscopic cholecystectomy    . Cardiac catheterization N/A 03/21/2015    Procedure: Left Heart Cath and Coronary Angiography;  Surgeon: Lyn Records, MD;  Location: [facility] INVASIVE CV LAB;  Service: Cardiovascular;  Laterality: N/A;   Family History: History reviewed. No pertinent family history. Social History:  History  Alcohol Use No     History  Drug Use No    History   Social History  . Marital Status: Married  Spouse Name: N/A  . Number of Children: N/A  . Years of Education: N/A   Social History Main Topics  . Smoking status: Former Smoker -- 1.00 packs/day for 30 years    Types: Cigarettes  . Smokeless tobacco: Never Used  . Alcohol Use: No  . Drug Use: No  . Sexual Activity: Not on file   Other Topics Concern  . None   Social  History Narrative   Truck driver      Additional History:    Sleep: improved   Appetite:  Fair    Musculoskeletal: Strength & Muscle Tone: within normal limits Gait & Station: normal Patient leans: N/A   Psychiatric Specialty Exam: Physical Exam  Nursing note and vitals reviewed. Constitutional: He is oriented to person, place, and time.  Neck: Normal range of motion.  Respiratory: Effort normal and breath sounds normal.  Neurological: He is alert and oriented to person, place, and time.  Psychiatric: His speech is normal. His mood appears anxious. His affect is angry. He is agitated and combative. Cognition and memory are normal. He exhibits a depressed mood. He expresses suicidal ideation.    ROS- today  Denies nausea, vomiting, diarrhea, Denies chest pain or SOB. Denies any bleeding or any melenas.  Blood pressure 115/77, pulse 86, temperature 99 F (37.2 C), temperature source Oral, resp. rate 18, height  (1.803 m), weight 242 lb (109.77 kg), SpO2 99 %.Body mass index is 33.77 kg/(m^2).  General Appearance: Casual  Eye Contact::  Improved   Speech:  Normal Rate  Volume:  Normal  Mood:   Mood partially improved   Affect:  Less constricted, more reactive, anxious  Thought Process:  Linear   Orientation:  Full (Time, Place, and Person)  Thought Content:  Denies hallucinations, no delusions expressed , ruminative about stressors   Suicidal Thoughts:  No at this time denies any thoughts of hurting self and  contracts for safety on unit   Homicidal Thoughts:  today denies any HI  Memory:  Immediate;   Good Recent;   Good Remote;   Good  Judgement:  Fair  Insight:  Fair  Psychomotor Activity:  More visible in dayroom  Concentration:  Fair  Recall:  Good  Fund of Knowledge:Fair  Language: Good  Akathisia:  Negative  Handed:  Right  AIMS (if indicated):     Assets:  Communication Skills  ADL's:  Intact  Cognition: WNL  Sleep:  Number of Hours: 6.75      Current Medications: Current Facility-Administered Medications  Medication Dose Route Frequency Provider Last Rate Last Dose  . aspirin EC tablet 81 mg  81 mg Oral q morning - 10a Earney Navy, NP   81 mg at 03/30/15 0959  . atorvastatin (LIPITOR) tablet 40 mg  40 mg Oral QPM Earney Navy, NP   40 mg at 03/29/15 1721  . clonazePAM (KLONOPIN) tablet 0.5 mg  0.5 mg Oral TID PRN Jomarie Longs, MD   0.5 mg at 03/30/15 0641  . clopidogrel (PLAVIX) tablet 75 mg  75 mg Oral Q breakfast Earney Navy, NP   75 mg at 03/30/15 1610  . hydrOXYzine (ATARAX/VISTARIL) tablet 50 mg  50 mg Oral TID PRN Worthy Flank, NP   50 mg at 03/29/15 2104  . ibuprofen (ADVIL,MOTRIN) tablet 600 mg  600 mg Oral Q6H PRN Adonis Brook, NP   600 mg at 03/30/15 1003  . insulin aspart (novoLOG) injection 5 Units  5 Units Subcutaneous TID  WC Earney NavyJosephine C Onuoha, NP   5 Units at 03/30/15 1204  . insulin detemir (LEVEMIR) injection 50 Units  50 Units Subcutaneous Q12H Earney NavyJosephine C Onuoha, NP   50 Units at 03/30/15 [id number]  . isosorbide mononitrate (IMDUR) 24 hr tablet 60 mg  60 mg Oral q morning - 10a Earney NavyJosephine C Onuoha, NP   60 mg at 03/30/15 0959  . lisinopril (PRINIVIL,ZESTRIL) tablet 40 mg  40 mg Oral q morning - 10a Earney NavyJosephine C Onuoha, NP   40 mg at 03/30/15 0959  . metFORMIN (GLUCOPHAGE) tablet 1,000 mg  1,000 mg Oral BID WC Earney NavyJosephine C Onuoha, NP   1,000 mg at 03/30/15 0821  . metoprolol succinate (TOPROL-XL) 24 hr tablet 50 mg  50 mg Oral Daily Earney NavyJosephine C Onuoha, NP   50 mg at 03/30/15 [id number]  . nicotine (NICODERM CQ - dosed in mg/24 hours) patch 21 mg  21 mg Transdermal Daily Earney NavyJosephine C Onuoha, NP   21 mg at 03/30/15 0800  . nitroGLYCERIN (NITROSTAT) SL tablet 0.4 mg  0.4 mg Sublingual Q5 Min x 3 PRN Earney NavyJosephine C Onuoha, NP      . pantoprazole (PROTONIX) EC tablet 40 mg  40 mg Oral Q1200 Earney NavyJosephine C Onuoha, NP   40 mg at 03/30/15 1204  . sertraline (ZOLOFT) tablet 50 mg  50 mg Oral Daily Craige CottaFernando A  Avayah Raffety, MD   50 mg at 03/30/15 [id number]  . spironolactone (ALDACTONE) tablet 25 mg  25 mg Oral Daily Earney NavyJosephine C Onuoha, NP   25 mg at 03/30/15 [id number]  . zolpidem (AMBIEN) tablet 10 mg  10 mg Oral QHS PRN Rachael FeeIrving A Lugo, MD   10 mg at 03/29/15 2104    Lab Results:  Results for orders placed or performed during the hospital encounter of 03/25/15 (from the past 48 hour(s))  Glucose, capillary     Status: Abnormal   Collection Time: 03/28/15  4:49 PM  Result Value Ref Range   Glucose-Capillary 132 (H) 65 - 99 mg/dL   Comment 1 Notify RN   HIV antibody     Status: None   Collection Time: 03/28/15  7:20 PM  Result Value Ref Range   HIV Screen 4th Generation wRfx Non Reactive Non Reactive    Comment: (NOTE) Performed At: [facility] [place] [address] [place], [place] [id number] Mila HomerHancock William F MD ZH:[id number]:[phone] Performed at [facility]   Glucose, capillary     Status: Abnormal   Collection Time: 03/29/15  6:16 AM  Result Value Ref Range   Glucose-Capillary 138 (H) 65 - 99 mg/dL  Glucose, capillary     Status: Abnormal   Collection Time: 03/29/15 12:11 PM  Result Value Ref Range   Glucose-Capillary 161 (H) 65 - 99 mg/dL   Comment 1 Notify RN    Comment 2 Document in Chart   Glucose, capillary     Status: Abnormal   Collection Time: 03/29/15  5:08 PM  Result Value Ref Range   Glucose-Capillary 145 (H) 65 - 99 mg/dL  Glucose, capillary     Status: Abnormal   Collection Time: 03/30/15  6:17 AM  Result Value Ref Range   Glucose-Capillary 133 (H) 65 - 99 mg/dL  Glucose, capillary     Status: Abnormal   Collection Time: 03/30/15 11:46 AM  Result Value Ref Range   Glucose-Capillary 117 (H) 65 - 99 mg/dL   Comment 1 Notify RN    Comment 2 Document in Chart     Physical Findings:  AIMS: Facial and Oral Movements Muscles of Facial Expression: None, normal Lips and Perioral Area: None, normal Jaw: None, normal Tongue: None, normal,Extremity Movements Upper (arms,  wrists, hands, fingers): None, normal Lower (legs, knees, ankles, toes): None, normal, Trunk Movements Neck, shoulders, hips: None, normal, Overall Severity Severity of abnormal movements (highest score from questions above): None, normal Incapacitation due to abnormal movements: None, normal Patient's awareness of abnormal movements (rate only patient's report): No Awareness, Dental Status Current problems with teeth and/or dentures?: No Does patient usually wear dentures?: No  CIWA:  CIWA-Ar Total: 1 COWS:  COWS Total Score: 3   Assessment:  Patient is  Improving compared to admission, and is presenting with improved mood, less irritability, less isolation, and is more future oriented. Anxiety persists and at this time is focused on disposition planning. Patient is motivated in going to a long term sober setting [employer] ) . He is denying medication side effects at this time and GI symptoms he had reported yesterday have resolved . Of note, denies any bleeding or melenas .       Treatment Plan Summary: Daily contact with patient to assess and evaluate symptoms and progress in treatment and Medication management Continue Zoloft 50 mgrs QDAY for depression Decrease Klonopin   To 0.5 mg po bid  prn for  Anxiety - patient understands rationale to D/C Klonopin prior to discharge  Continue Ambien 10 mgrs QHS for Insomnia. On CIWA protocol  to minimize risk of alcohol WDL On Imdur, Aldactone, Toprol, Lisinopril, Plavix , ASA for history of CAD. On Levemir Novolog Insulin for history of Diabetes Mellitus  Patient interested in going to a Rehab setting after discharge. We discussed medication side effects and potential drug drug interactions- aware of increased risk of bleeding, particularly GI bleed, on ASA, Plavix , SSRI.       Medical Decision Making:  Review of Psycho-Social Stressors (1), Review or order clinical lab tests (1), Review of Last Therapy Session (1) and Review of  Medication Regimen & Side Effects (2)   Nehemiah Massed, MD 03/30/2015, 3:16 PM

## 2015-03-30 NOTE — Progress Notes (Signed)
Patient ID: Jake KirschnerDavid Mikels, male   DOB: 05/05/1966, 49 y.o.   MRN: [id number] D: Patient reports being anxious and feeling hopeless but reports that he is not depressed.. Anxiety 6 on 1 to 10 scale.   Primary concern is his anxiety and getting it under control without narcotics.Complaining of back pain. Patient attending groups and interacting well with peers. Not sleeping well.  A: Patient given emotional support from RN. Patient given medications per MD orders. Patient encouraged to attend groups and unit activities. Patient encouraged to come to staff with any questions or concerns.  R: Patient remains cooperative and appropriate. Will continue to monitor patient for safety.

## 2015-03-30 NOTE — Progress Notes (Signed)
Patient in day room at the beginning of this shift. He appeared sad and depressed. He reported having difficulty sleeping last night. He denied SI/HI and denied Hallucinations. Writer encouraged patient to notified staff if unable to sleep tonight. Q 15 minute check continues as ordered to maintain safety.

## 2015-03-30 NOTE — Progress Notes (Signed)
Adult Psychoeducational Group Note  Date:  03/30/2015 Time:  11:23 PM  Group Topic/Focus:  Wrap-Up Group:   The focus of this group is to help patients review their daily goal of treatment and discuss progress on daily workbooks.  Participation Level:  Minimal  Participation Quality:  Resistant  Affect:  Blunted and Flat  Cognitive:  Lacking  Insight: Limited  Engagement in Group:  Defensive, Limited and Resistant  Modes of Intervention:  Discussion  Additional Comments:  Patients goal was to get ready for discharge as well as finding a place to stay, specifically a treatment facility immediately after discharge. He was in group having side conversation with a peer and resistant to sharing with the group.  Jake Mayo 03/30/2015, 11:23 PM

## 2015-03-30 NOTE — [facility] Group Notes (Signed)
[facility]   03/30/2015 1:06 PM    Participation Quality:  Appropraite  Mood/Affect:  Appropriate  Depression Rating:  3  Anxiety Rating:  6  Thoughts of Suicide:  No  Will you contract for safety?   NA  Current AVH:  No  Plan for Discharge/Comments:  Patient attended discharge planning group and actively participated in group. [facility]  Suicide prevention education reviewed and SPE document provided.   Transportation Means: Patient has transportation.   Supports:  Patient has a support system.   Jake Mayo, Jake Mayo

## 2015-03-30 NOTE — Progress Notes (Signed)
Recreation Therapy Notes  Date: 06.01.16 Time: 9:30am Location: [address] Dayroom  Group Topic: Stress Management  Goal Area(s) Addresses:  Patient will verbalize importance of using healthy stress management.  Patient will identify positive emotions associated with healthy stress management.    Behavioral Response:  Engaged  Intervention: [place] Guided Imagery Script  Activity : Guided Imagery Script. LRT introduced and educated patients on stress management technique of guided imagery.  Mayo script was used to deliver the technique to patients.  Patients were asked to follow script read by LRT to engage in practicing stress management technique.   Education:  Stress Management, Discharge Planning.   Education Outcome: Acknowledges edcuation/In group clarification offered/Needs additional education  Clinical Observations/Feedback: Patient attended group and was engaged.  Jake Mayo, LRT/CTRS         Jake Mayo 03/30/2015 3:38 PM 

## 2015-03-31 LAB — GLUCOSE, CAPILLARY
GLUCOSE-CAPILLARY: 195 mg/dL — AB (ref 65–99)
Glucose-Capillary: 115 mg/dL — ABNORMAL HIGH (ref 65–99)
Glucose-Capillary: 124 mg/dL — ABNORMAL HIGH (ref 65–99)
Glucose-Capillary: 128 mg/dL — ABNORMAL HIGH (ref 65–99)

## 2015-03-31 MED ORDER — CLONAZEPAM 0.5 MG PO TABS
0.5000 mg | ORAL_TABLET | Freq: Two times a day (BID) | ORAL | Status: DC | PRN
  Administered 2015-04-01 – 2015-04-02 (×3): 0.5 mg via ORAL
  Filled 2015-03-31 (×3): qty 1

## 2015-03-31 MED ORDER — SERTRALINE HCL 25 MG PO TABS
75.0000 mg | ORAL_TABLET | Freq: Every day | ORAL | Status: DC
  Administered 2015-04-01: 75 mg via ORAL
  Filled 2015-03-31 (×3): qty 3

## 2015-03-31 MED ORDER — ACAMPROSATE CALCIUM 333 MG PO TBEC
666.0000 mg | DELAYED_RELEASE_TABLET | Freq: Three times a day (TID) | ORAL | Status: DC
  Administered 2015-04-01 – 2015-04-02 (×4): 666 mg via ORAL
  Filled 2015-03-31 (×3): qty 2
  Filled 2015-03-31 (×3): qty 84
  Filled 2015-03-31: qty 2
  Filled 2015-03-31 (×2): qty 84
  Filled 2015-03-31 (×2): qty 2
  Filled 2015-03-31: qty 84
  Filled 2015-03-31 (×2): qty 2

## 2015-03-31 NOTE — Progress Notes (Signed)
Patient was given TB skin test.  Examined left forearm.  Results:  negative

## 2015-03-31 NOTE — Progress Notes (Signed)
Patient ID: Jake KirschnerDavid Shams, male   DOB: [birth year], 49 y.o.   MRN: [id number] Requested phone from Search room and given to him and observed him looking up numbers to contact to [facility] tomorrow. He wrote down five numbers to call for a ride. He has been anxious and sad all day worrying about where he will go when he leaves here. Had a prn Klonopin for his anxiety with some relief.

## 2015-03-31 NOTE — Progress Notes (Signed)
[facility] Group Notes:  (Nursing/MHT/Case Management/Adjunct)  Date:  03/31/2015  Time:  10:29 PM  Type of Therapy:  Psychoeducational Skills  Participation Level:  Minimal  Participation Quality:  Attentive  Affect:  Angry and Labile  Cognitive:  Appropriate  Insight:  Lacking  Engagement in Group:  Limited  Modes of Intervention:  Education  Summary of Progress/Problems: The patient shared in group that he will be making phone calls tomorrow in order to find a bed in a rehabilitation center. He is also concerned about [place] [facility]. His goal for tomorrow is to secure housing.   Hazle CocaGOODMAN, Shallon Yaklin S 03/31/2015, 10:29 PM

## 2015-03-31 NOTE — [facility] Group Notes (Signed)
[facility] LCSW Group Therapy  [employer] 1:15 - 2:30 PM  03/31/2015   Type of Therapy:  Group Therapy  Participation Level: Active  Participation Quality:  Attentive  Affect:  Appropriate  Cognitive:  Appropriate  Insight:  Developing/Improving   Engagement in Therapy:  Developing/Improving   Modes of Intervention:  Discussion, Education, Exploration, Problem-Solving, Rapport Building, Support   Summary of Progress/Problems:   Patient was attentive to speaker from the [employer] as he shared his story of dealing with mental health/substance abuse issues and overcoming it by working a recovery program.  Patient asked questions about the speaker's story.  Wynn BankerHodnett, Azia Toutant Hairston 03/31/2015

## 2015-03-31 NOTE — Progress Notes (Addendum)
Patient ID: Jake Mayo, male   DOB: Aug 29, 1966, 49 y.o.   MRN: [id number]  [facility]  03/31/2015 Elmon Kirschner5:13 PM Elmon KirschnerDavid Schuld  MRN:  [id number]   Subjective: patient describes anxiety particularly relating to disposition planning. He is concerned that his attempt to get into a Rehab setting will not succeed and states he feels that " if I go back to the streets I will be at high risk of relapsing, even though I do not want to drink". He does acknowledge he is having significant cravings to drink today, related to increased anxiety. At this time denies medication side effects..  Objective: Patient seen and chart reviewed.Discussed patient with treatment team.  As discussed with staff patient remains depressed , anxious.  Patient remains  calm, no disruptive behaviors  On unit ,  Has become more visible in milieu. Tolerating medications ( Zoloft titration ) well and currently does not describe side effects. As discussed with CSW information and required [facility]- no information back yet regarding whether he has been accepted . Today presents more anxious, ruminative. States mood is " a little better". Ruminates about homelessness and disposition planning . Responds partially to support, empathy, encouragement..      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 Alcohol use disorder, moderate, dependence [F10.20] 03/28/2015  . 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 Generalized anxiety disorder [F41.1] 03/25/2015  . 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 LBBB  [I44.7] 03/22/2015  . ICM- EF 35-40% at cath 03/21/15 [I25.5] 03/22/2015  . Palpitations [R00.2]   . Non-STEMI- Troponin 0.23  [I21.4] 03/20/2015  . CAD S/P multiple prior PCIs, cath 02/2315- all patent [I25.10, Z98.61]   . Hypertension [I10]   . Diabetes mellitus without complication [E11.9]   . Obesity (BMI 30-39.9) [E66.9]   . Hyperlipidemia [E78.5]    Total Time spent with patient: 20 minutes   Past Medical History:  Past Medical History  Diagnosis Date  . Coronary artery disease   . Hypertension   . Diabetes mellitus without complication   . Obesity (BMI 30-39.9)   . Hyperlipidemia     Past Surgical History  Procedure Laterality Date  . Coronary angioplasty with stent placement    . Laparoscopic cholecystectomy    . Cardiac catheterization N/A 03/21/2015    Procedure: Left Heart Cath and Coronary Angiography;  Surgeon: Lyn RecordsHenry W Smith, MD;  [facility];  Service: Cardiovascular;  Laterality: N/A;   Family History: History reviewed. No pertinent family history. Social History:  History  Alcohol Use No     History  Drug Use No    History   Social History  . Marital Status: Married    Spouse Name: N/A  . Number of Children: N/A  . Years of Education: N/A   Social History Main Topics  . Smoking status: Former Smoker -- 1.00 packs/day for 30 years    Types: Cigarettes  . Smokeless tobacco: Never Used  . Alcohol Use: No  . Drug Use: No  . Sexual Activity: Not on file   Other Topics Concern  . None   Social History Narrative   Truck driver      Additional History:    Sleep: improved   Appetite:  Fair    Musculoskeletal: Strength & Muscle Tone: within normal limits Gait & Station: normal Patient leans: N/A   Psychiatric Specialty Exam: Physical Exam  Nursing note and vitals reviewed. Constitutional: He is oriented to person, place, and time.  Neck: Normal range of motion.  Respiratory: Effort normal and breath sounds normal.  Neurological: He is alert and oriented to person, place, and time.  Psychiatric: His speech is normal. His mood appears anxious.  His affect is angry. He is agitated and combative. Cognition and memory are normal. He exhibits a depressed mood. He expresses suicidal ideation.    ROS- today  Denies nausea, vomiting, diarrhea, Denies chest pain or SOB. Denies any bleeding or any melenas.  Blood pressure 129/81, pulse 75, temperature 99.2 F (37.3 C), temperature source Oral, resp. rate 18, height 5\' 11"  (1.803 m), weight 242 lb (109.77 kg), SpO2 99 %.Body mass index is 33.77 kg/(m^2).  General Appearance: Casual  Eye Contact::  Fair   Speech:  Normal Rate  Volume:  Normal  Mood:   Mood partially improved compared to admission but still anxious    Affect:  Less constricted, more reactive, anxious  Thought Process:  Linear   Orientation:  Full (Time, Place, and Person)  Thought Content:  Denies hallucinations, no delusions expressed , ruminative   Suicidal Thoughts:  No at this time denies any thoughts of hurting self and  contracts for safety on unit   Homicidal Thoughts:  today denies any HI  Memory:  Immediate;   Good Recent;   Good Remote;   Good  Judgement:  Fair  Insight:  Fair  Psychomotor Activity:  More visible in dayroom  Concentration:  Fair  Recall:  Good  Fund of Knowledge:Fair  Language: Good  Akathisia:  Negative  Handed:  Right  AIMS (if indicated):     Assets:  Communication Skills  ADL's:  Intact  Cognition: WNL  Sleep:  Number of Hours: 6.5     Current Medications: Current Facility-Administered Medications  Medication Dose Route Frequency Provider Last Rate Last Dose  . aspirin EC tablet 81 mg  81 mg Oral q morning - 10a Earney Navy, NP   81 mg at 03/31/15 0933  . atorvastatin (LIPITOR) tablet 40 mg  40 mg Oral QPM Earney Navy, NP   40 mg at 03/30/15 1715  . clonazePAM (KLONOPIN) tablet 0.5 mg  0.5 mg Oral TID PRN Jomarie Longs, MD   0.5 mg at 03/31/15 1650  . clopidogrel (PLAVIX) tablet 75 mg  75 mg Oral Q breakfast Earney Navy, NP   75 mg at 03/31/15 1610  .  hydrOXYzine (ATARAX/VISTARIL) tablet 50 mg  50 mg Oral TID PRN Worthy Flank, NP   50 mg at 03/31/15 1120  . ibuprofen (ADVIL,MOTRIN) tablet 600 mg  600 mg Oral Q6H PRN Adonis Brook, NP   600 mg at 03/30/15 1003  . insulin aspart (novoLOG) injection 5 Units  5 Units Subcutaneous TID WC Earney Navy, NP   5 Units at 03/31/15 1710  . insulin detemir (LEVEMIR) injection 50 Units  50 Units Subcutaneous Q12H Earney Navy, NP   50 Units at 03/31/15 0829  . isosorbide mononitrate (IMDUR) 24 hr tablet 60 mg  60 mg Oral q morning - 10a Earney Navy, NP   60 mg at 03/31/15 0856  . lisinopril (PRINIVIL,ZESTRIL) tablet 40 mg  40 mg Oral q morning - 10a Earney Navy, NP   40 mg at  03/31/15 0856  . metFORMIN (GLUCOPHAGE) tablet 1,000 mg  1,000 mg Oral BID WC Earney Navy, NP   1,000 mg at 03/31/15 1711  . metoprolol succinate (TOPROL-XL) 24 hr tablet 50 mg  50 mg Oral Daily Earney Navy, NP   50 mg at 03/31/15 0830  . nicotine (NICODERM CQ - dosed in mg/24 hours) patch 21 mg  21 mg Transdermal Daily Earney Navy, NP   21 mg at 03/31/15 0851  . nitroGLYCERIN (NITROSTAT) SL tablet 0.4 mg  0.4 mg Sublingual Q5 Min x 3 PRN Earney Navy, NP      . pantoprazole (PROTONIX) EC tablet 40 mg  40 mg Oral Q1200 Earney Navy, NP   40 mg at 03/31/15 1202  . [START ON 04/01/2015] sertraline (ZOLOFT) tablet 75 mg  75 mg Oral Daily Craige Cotta, MD      . spironolactone (ALDACTONE) tablet 25 mg  25 mg Oral Daily Earney Navy, NP   25 mg at 03/31/15 0830  . zolpidem (AMBIEN) tablet 10 mg  10 mg Oral QHS PRN Rachael Fee, MD   10 mg at 03/30/15 2113    Lab Results:  Results for orders placed or performed during the hospital encounter of 03/25/15 (from the past 48 hour(s))  Glucose, capillary     Status: Abnormal   Collection Time: 03/30/15  6:17 AM  Result Value Ref Range   Glucose-Capillary 133 (H) 65 - 99 mg/dL  Glucose, capillary     Status: Abnormal    Collection Time: 03/30/15 11:46 AM  Result Value Ref Range   Glucose-Capillary 117 (H) 65 - 99 mg/dL   Comment 1 Notify RN    Comment 2 Document in Chart   Glucose, capillary     Status: Abnormal   Collection Time: 03/30/15  4:52 PM  Result Value Ref Range   Glucose-Capillary 113 (H) 65 - 99 mg/dL   Comment 1 Notify RN    Comment 2 Document in Chart   Glucose, capillary     Status: Abnormal   Collection Time: 03/30/15  9:02 PM  Result Value Ref Range   Glucose-Capillary 134 (H) 65 - 99 mg/dL  Glucose, capillary     Status: Abnormal   Collection Time: 03/31/15  6:08 AM  Result Value Ref Range   Glucose-Capillary 115 (H) 65 - 99 mg/dL  Glucose, capillary     Status: Abnormal   Collection Time: 03/31/15 12:01 PM  Result Value Ref Range   Glucose-Capillary 124 (H) 65 - 99 mg/dL   Comment 1 Notify RN    Comment 2 Document in Chart   Glucose, capillary     Status: Abnormal   Collection Time: 03/31/15  4:46 PM  Result Value Ref Range   Glucose-Capillary 195 (H) 65 - 99 mg/dL    Physical Findings: AIMS: Facial and Oral Movements Muscles of Facial Expression: None, normal Lips and Perioral Area: None, normal Jaw: None, normal Tongue: None, normal,Extremity Movements Upper (arms, wrists, hands, fingers): None, normal Lower (legs, knees, ankles, toes): None, normal, Trunk Movements Neck, shoulders, hips: None, normal, Overall Severity Severity of abnormal movements (highest score from questions above): None, normal Incapacitation due to abnormal movements: None, normal Patient's awareness of abnormal movements (rate only patient's report): No Awareness, Dental Status Current problems with teeth and/or dentures?: No Does patient usually wear dentures?: No  CIWA:  CIWA-Ar Total: 0 COWS:  COWS Total Score: 2   Assessment:  Patient  Is more  anxious today, and focused on disposition options. He has completed CIWA protocol and is not presenting with current alcohol WDL symptoms.  He  is hoping to get into [facility], and is worried he might not be accepted . He worries about homelessness and increased risk of relapse  If not going to a structured environment. More cravings for alcohol today, but motivated in recovery. Tolerating medications well- has not had any further side effects from Zoloft titration. Of note, no episodes of Chest pain or SOB ( patient has history of CAD )        Treatment Plan Summary: Daily contact with patient to assess and evaluate symptoms and progress in treatment and Medication management Increase Zoloft to 75 mgrs QDAY for depression and anxiety Decrease  Klonopin   To 0.5 mg po BID   prn for  Anxiety - patient understands rationale to D/C Klonopin prior to discharge  Continue Ambien 10 mgrs QHS for Insomnia. Start Campral 666 mgrs TID for alcohol cravings  On Imdur, Aldactone, Toprol, Lisinopril, Plavix , ASA for history of CAD. On Levemir Novolog Insulin for history of Diabetes Mellitus  CSW working on referral/disposition planning.         Medical Decision Making:  Review of Psycho-Social Stressors (1), Review or order clinical lab tests (1), Review of Last Therapy Session (1) and Review of Medication Regimen & Side Effects (2)   Nehemiah Massed, MD 03/31/2015, 5:13 PM

## 2015-03-31 NOTE — Progress Notes (Signed)
Patient ID: Elmon KirschnerDavid Granillo, male   DOB: 02/17/1966, 49 y.o.   MRN: [id number] D-Completed self inventory and gave selfa 5 on depression scale, 10 for hopelessness re., and an 8 on anxiety. He is worried about his housing and discharge plans. He is now homeless, and while he has been before, he doesn't like it and doesn't want to go to a shelter. He had a prn Vistaril for anxiety. Very little noted peer interactions. ON self inventory personal goal seimply wrote I dont know.Is cooperative and no behavior issues. A-Support offered. Monitored for safety Medications as ordered.  R-No complaints at this time.

## 2015-04-01 LAB — GLUCOSE, CAPILLARY
Glucose-Capillary: 114 mg/dL — ABNORMAL HIGH (ref 65–99)
Glucose-Capillary: 100 mg/dL — ABNORMAL HIGH (ref 65–99)
Glucose-Capillary: 113 mg/dL — ABNORMAL HIGH (ref 65–99)

## 2015-04-01 MED ORDER — SERTRALINE HCL 100 MG PO TABS
100.0000 mg | ORAL_TABLET | Freq: Every day | ORAL | Status: DC
  Administered 2015-04-02: 100 mg via ORAL
  Filled 2015-04-01 (×2): qty 3
  Filled 2015-04-01 (×2): qty 1

## 2015-04-01 NOTE — [facility] Group Notes (Signed)
[facility] LCSW Group Therapy  Feelings Around Relapse 1:15 -2:30        04/01/2015   Type of Therapy:  Group Therapy  Participation Level:  Appropriate  Participation Quality:  Appropriate  Affect:  Appropriate  Cognitive:  Attentive Appropriate  Insight:  Developing/Improving  Engagement in Therapy: Developing/Improving  Modes of Intervention:  Discussion Exploration Problem-Solving Supportive  Summary of Progress/Problems:  The topic for today was feelings around relapse.    Patient processed feelings toward relapse and was able to relate to peers. He advised he would be drinking again.  Patient shared the sound of someone opening the tab on a soda can triggers his desire for alcohol.  Patient identified coping skills that can be used to prevent a relapse including [facility]   Wynn BankerHodnett, Darra Rosa Hairston 04/01/2015

## 2015-04-01 NOTE — Progress Notes (Signed)
D: Patient denies SI/HI and A/V hallucinations; patient reports sleep is fair; reports appetite is fair; reports energy level is low ; reports ability to concentrate is poor; rates depression as 3/10; rates hopelessness 2/10; rates anxiety as 10/10   A: Monitored q 15 minutes; patient encouraged to attend groups; patient educated about medications; patient given medications per physician orders; patient encouraged to express feelings and/or concerns  R: Patient is cooperative; patient is anxious and focused on transportation home; patient's interaction with staff and peers is assertive; patient was able to set goal to talk with staff 1:1 when having feelings of SI; patient is taking medications as prescribed and tolerating medications

## 2015-04-01 NOTE — Progress Notes (Signed)
Patient remains grumpy and nonchalant. He appeared sad and irritable. He reported that he will be leaving to morrow but have no transportation. Writer encouraged patient to discussed with the case manager or the social worker in the morning. His CBG is improving. He denied SI/HI and denied Hallucinations. Writer encouraged and supported patient. Q 15 minute check continues as ordered to maintain safety.

## 2015-04-01 NOTE — Progress Notes (Signed)
Recreation Therapy Notes  Date: 06.03.16 Time: 9:30am Location: 300 Group Room  Group Topic: Stress Management  Goal Area(s) Addresses:  Patient will verbalize importance of using healthy stress management.  Patient will identify positive emotions associated with healthy stress management.   Intervention: Stress Management  Activity :  Progressive Muscle Relaxation.  LRT introduced patients to stress management technique of progressive muscle relaxation.  Mayo script was used to deliver the technique and patients were asked to follow script read by LRT to engage in practicing the stress management technique.    Education:  Stress Management, Discharge Planning.   Education Outcome: Acknowledges edcuation/In group clarification offered  Clinical Observations/Feedback: Patient did not attend group.    Klein Willcox, LRT/CTRS         Jake Mayo 04/01/2015 1:48 PM 

## 2015-04-01 NOTE — Progress Notes (Signed)
Patient ID: Jake Mayo, male   DOB: 1966/09/07, 49 y.o.   MRN: [id number]  [facility] MD Progress Note  04/01/2015 2:31 PM Jake Mayo  MRN:  [id number]   Subjective: he states he is feeling better, but remains anxious. At this time his anxiety is focused around discharge/disposition planning. He is very motivated in going to [facility] in [place], [place] for 2-3 weeks. From there he plans to go to [facility], which is a long term program of several months . He states that he has been trying to reach his son to get transportation to said programs but has not been able to . Quite anxious about this , states " I have very little here, and I think I would relapse if I don't go to Rehab".  Denies medication side effects.  Objective: Patient seen and chart reviewed.Discussed patient with treatment team.  Improved compared to yesterday/admission , but still quite anxious, and has difficulty with stressors such as noted above, with significant anxiety. Depression is improving  , and he states his mood " is OK" today. Denies medication side effects. No disruptive behaviors on unit. Some group participation. Staff reports patient has tended to be  Sad and anxious . Patient reports alcohol cravings in the context of his current anxiety. Campral has been well tolerated . Responds partially to support, empathy, and helping him address his stressors. Efforts were made to contact son without success.      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 Alcohol use disorder, moderate, dependence [F10.20] 03/28/2015  . 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 Generalized anxiety disorder [F41.1] 03/25/2015  . 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 LBBB  [I44.7] 03/22/2015  . ICM- EF 35-40% at cath 03/21/15 [I25.5] 03/22/2015  . Palpitations [R00.2]   . Non-STEMI- Troponin 0.23 [I21.4] 03/20/2015  . CAD S/P multiple prior PCIs, cath 02/2315- all patent [I25.10, Z98.61]   . Hypertension [I10]   . Diabetes mellitus without complication [E11.9]   . Obesity (BMI 30-39.9) [E66.9]   . Hyperlipidemia [E78.5]    Total Time spent with patient: 20 minutes   Past Medical History:  Past Medical History  Diagnosis Date  . Coronary artery disease   . Hypertension   . Diabetes mellitus without complication   . Obesity (BMI 30-39.9)   . Hyperlipidemia     Past Surgical History  Procedure Laterality Date  . Coronary angioplasty with stent placement    . Laparoscopic cholecystectomy    . Cardiac catheterization N/A 03/21/2015    Procedure: Left Heart Cath and Coronary Angiography;  Surgeon: Lyn Records, MD;  Location: [facility] INVASIVE CV LAB;  Service: Cardiovascular;  Laterality: N/A;   Family History: History reviewed. No pertinent family history. Social History:  History  Alcohol Use No     History  Drug Use No    History   Social History  . Marital Status: Married    Spouse Name: N/A  . Number of Children: N/A  . Years of Education: N/A   Social History Main Topics  . Smoking status: Former Smoker -- 1.00 packs/day for 30 years    Types: Cigarettes  . Smokeless tobacco: Never Used  . Alcohol Use: No  . Drug Use: No  . Sexual  Activity: Not on file   Other Topics Concern  . None   Social History Narrative   Truck driver      Additional History:    Sleep: fair- patient states he has been on Ambien for " many years, without it I just can't sleep".  Appetite:  Fair    Musculoskeletal: Strength & Muscle Tone: within normal limits Gait & Station: normal Patient leans: N/A   Psychiatric Specialty Exam: Physical Exam  Nursing note and vitals reviewed. Constitutional: He is oriented  to person, place, and time.  Neck: Normal range of motion.  Respiratory: Effort normal and breath sounds normal.  Neurological: He is alert and oriented to person, place, and time.  Psychiatric: His speech is normal. His mood appears anxious. His affect is angry. He is agitated and combative. Cognition and memory are normal. He exhibits a depressed mood. He expresses suicidal ideation.    ROS- today  Denies nausea, vomiting, diarrhea, Denies chest pain or SOB. Denies any bleeding or any melenas.  Blood pressure 139/75, pulse 75, temperature 97.7 F (36.5 C), temperature source Oral, resp. rate 16, height  (1.803 m), weight 242 lb (109.77 kg), SpO2 99 %.Body mass index is 33.77 kg/(m^2).  General Appearance: Casual  Eye Contact::  Fair   Speech:  Normal Rate  Volume:  Normal  Mood:   Reports mood improved,  Affect: anxious today, ruminative .  Thought Process:  Linear   Orientation:  Full (Time, Place, and Person)  Thought Content:  Denies hallucinations, no delusions expressed , ruminative   Suicidal Thoughts:  No at this time denies any thoughts of hurting self and  contracts for safety on unit   Homicidal Thoughts:  today denies any HI  Memory:  Immediate;   Good Recent;   Good Remote;   Good  Judgement:  Fair  Insight:  Fair  Psychomotor Activity:  More visible in dayroom  Concentration:  Fair  Recall:  Good  Fund of Knowledge:Fair  Language: Good  Akathisia:  Negative  Handed:  Right  AIMS (if indicated):     Assets:  Communication Skills  ADL's:  Intact  Cognition: WNL  Sleep:  Number of Hours: 5.75     Current Medications: Current Facility-Administered Medications  Medication Dose Route Frequency Provider Last Rate Last Dose  . acamprosate (CAMPRAL) tablet 666 mg  666 mg Oral TID WC Craige Cotta, MD   666 mg at 04/01/15 1204  . aspirin EC tablet 81 mg  81 mg Oral q morning - 10a Earney Navy, NP   81 mg at 04/01/15 1014  . atorvastatin (LIPITOR)  tablet 40 mg  40 mg Oral QPM Earney Navy, NP   40 mg at 03/31/15 1822  . clonazePAM (KLONOPIN) tablet 0.5 mg  0.5 mg Oral BID PRN Craige Cotta, MD   0.5 mg at 04/01/15 0801  . clopidogrel (PLAVIX) tablet 75 mg  75 mg Oral Q breakfast Earney Navy, NP   75 mg at 04/01/15 0802  . hydrOXYzine (ATARAX/VISTARIL) tablet 50 mg  50 mg Oral TID PRN Worthy Flank, NP   50 mg at 03/31/15 2105  . ibuprofen (ADVIL,MOTRIN) tablet 600 mg  600 mg Oral Q6H PRN Adonis Brook, NP   600 mg at 03/30/15 1003  . insulin aspart (novoLOG) injection 5 Units  5 Units Subcutaneous TID WC Earney Navy, NP   5 Units at 04/01/15 1205  . insulin detemir (LEVEMIR) injection 50 Units  50 Units Subcutaneous Q12H Earney Navy, NP   50 Units at 04/01/15 0804  . isosorbide mononitrate (IMDUR) 24 hr tablet 60 mg  60 mg Oral q morning - 10a Earney Navy, NP   60 mg at 04/01/15 1014  . lisinopril (PRINIVIL,ZESTRIL) tablet 40 mg  40 mg Oral q morning - 10a Earney Navy, NP   40 mg at 04/01/15 0802  . metFORMIN (GLUCOPHAGE) tablet 1,000 mg  1,000 mg Oral BID WC Earney Navy, NP   1,000 mg at 04/01/15 0802  . metoprolol succinate (TOPROL-XL) 24 hr tablet 50 mg  50 mg Oral Daily Earney Navy, NP   50 mg at 04/01/15 0802  . nicotine (NICODERM CQ - dosed in mg/24 hours) patch 21 mg  21 mg Transdermal Daily Earney Navy, NP   21 mg at 04/01/15 0802  . nitroGLYCERIN (NITROSTAT) SL tablet 0.4 mg  0.4 mg Sublingual Q5 Min x 3 PRN Earney Navy, NP      . pantoprazole (PROTONIX) EC tablet 40 mg  40 mg Oral Q1200 Earney Navy, NP   40 mg at 04/01/15 1204  . sertraline (ZOLOFT) tablet 75 mg  75 mg Oral Daily Craige Cotta, MD   75 mg at 04/01/15 4540  . spironolactone (ALDACTONE) tablet 25 mg  25 mg Oral Daily Earney Navy, NP   25 mg at 04/01/15 9811  . zolpidem (AMBIEN) tablet 10 mg  10 mg Oral QHS PRN Rachael Fee, MD   10 mg at 03/31/15 2105    Lab Results:   Results for orders placed or performed during the hospital encounter of 03/25/15 (from the past 48 hour(s))  Glucose, capillary     Status: Abnormal   Collection Time: 03/30/15  4:52 PM  Result Value Ref Range   Glucose-Capillary 113 (H) 65 - 99 mg/dL   Comment 1 Notify RN    Comment 2 Document in Chart   Glucose, capillary     Status: Abnormal   Collection Time: 03/30/15  9:02 PM  Result Value Ref Range   Glucose-Capillary 134 (H) 65 - 99 mg/dL  Glucose, capillary     Status: Abnormal   Collection Time: 03/31/15  6:08 AM  Result Value Ref Range   Glucose-Capillary 115 (H) 65 - 99 mg/dL  Glucose, capillary     Status: Abnormal   Collection Time: 03/31/15 12:01 PM  Result Value Ref Range   Glucose-Capillary 124 (H) 65 - 99 mg/dL   Comment 1 Notify RN    Comment 2 Document in Chart   Glucose, capillary     Status: Abnormal   Collection Time: 03/31/15  4:46 PM  Result Value Ref Range   Glucose-Capillary 195 (H) 65 - 99 mg/dL  Glucose, capillary     Status: Abnormal   Collection Time: 03/31/15  8:34 PM  Result Value Ref Range   Glucose-Capillary 128 (H) 65 - 99 mg/dL  Glucose, capillary     Status: Abnormal   Collection Time: 04/01/15  6:01 AM  Result Value Ref Range   Glucose-Capillary 114 (H) 65 - 99 mg/dL  Glucose, capillary     Status: Abnormal   Collection Time: 04/01/15 11:54 AM  Result Value Ref Range   Glucose-Capillary 100 (H) 65 - 99 mg/dL   Comment 1 Notify RN    Comment 2 Document in Chart     Physical Findings: AIMS: Facial and Oral Movements Muscles of Facial Expression: None, normal Lips  and Perioral Area: None, normal Jaw: None, normal Tongue: None, normal,Extremity Movements Upper (arms, wrists, hands, fingers): None, normal Lower (legs, knees, ankles, toes): None, normal, Trunk Movements Neck, shoulders, hips: None, normal, Overall Severity Severity of abnormal movements (highest score from questions above): None, normal Incapacitation due to  abnormal movements: None, normal Patient's awareness of abnormal movements (rate only patient's report): No Awareness, Dental Status Current problems with teeth and/or dentures?: No Does patient usually wear dentures?: No  CIWA:  CIWA-Ar Total: 0 COWS:  COWS Total Score: 2   Assessment:   Patient states he is feeling better overall , and is motivated in ongoing treatment. He has plan of going to an inpatient Rehab [facility]( [place]) and from there to a long term sober residential setting. Challenges such as having difficulty reaching adult son over the phone to help with transportation are causing significant anxiety, although no overt panic attacks. In the context  Of anxiety, patient is experiencing increased cravings for alcohol, and reported earlier that certain sounds ( such as someone drinking a soda) cause cravings to drink. He states he feels he is at high risk of relapse unless he goes to a Rehab. He is tolerating medications well. Denies SI at present.        Treatment Plan Summary: Daily contact with patient to assess and evaluate symptoms and progress in treatment and Medication management Increase Zoloft to 100  mgrs QDAY for depression and anxiety Decrease  Klonopin   To 0.5 mg po BID   prn for  Severe  Anxiety - patient understands this medication to be D/Cd  prior to discharge  Continue Ambien 10 mgrs QHS for Insomnia. Continue Campral 666 mgrs TID for alcohol cravings  On Imdur, Aldactone, Toprol, Lisinopril, Plavix , ASA for history of CAD. On Levemir Novolog Insulin for history of Diabetes Mellitus  At this time plan is for patient to discharge tomorrow with son, to go [facility] [place]- [facility] management and plans to follow up locally with PCP [facility]. States that after 3 weeks or so plans to go [facility] term recovery program , and that they provide connection to free  medical services where he will follow up for his medical conditions.         Medical Decision Making:  Review of Psycho-Social Stressors (1), Review or order clinical lab tests (1), Review of Last Therapy Session (1) and Review of Medication Regimen & Side Effects (2)   Nehemiah MassedOBOS, Yoshiaki Kreuser, MD 04/01/2015, 2:31 PM

## 2015-04-01 NOTE — [facility]HH Group Notes (Signed)
[facility]   04/01/2015 10:24 AM    Participation Quality:  Appropraite  Mood/Affect:  Appropriate  Depression Rating:  0  Anxiety Rating:  7 Thoughts of Suicide:  No  Will you contract for safety?   NA  Current AVH:  No  Plan for Discharge/Comments:  Patient attended discharge planning group and actively participated in group. Suicide prevention education reviewed and SPE document provided.   Transportation Means: Patient needs assistance with transportation.   Supports:  Patient does not a support system.   Omer Monter, Joesph JulyQuylle Hairston

## 2015-04-01 NOTE — Tx Team (Addendum)
Interdisciplinary Treatment Plan Update (Adult)  Date:  04/01/2015  Time Reviewed:  11:08 AM   Progress in Treatment: Attending groups: Patient is attending groups. Participating in groups:  Patient engages in discussion Taking medication as prescribed:  Patient is taking medications Tolerating medication:  Patient is tolerating medications Family/Significant othe contact made:   No, patient declined collateral contact Patient understands diagnosis:Yes, patient understands diagnosis and need for treatment Discussing patient identified problems/goals with staff:  Yes, patient is able to express goals/problems Medical problems stabilized or resolved:  Yes Denies suicidal/homicidal ideation: Yes, patient is denying SI/HI. Issues/concerns per patient self-inventory:   Other:  Discharge Plan or Barriers:  Patient [facility] [place]  Reason for Continuation of Hospitalization: Anxiety Depression Medication stabilization  Comments:  Additional comments:  Patient and CSW reviewed Patient Discharge Process Letter/Patient Involvement Form.  Patient verbalized understanding and signed form.  Patient and CSW also reviewed and identified patient's goals and treatment plan.  Patient verbalized understanding and agreed to plan.  Estimated length of stay: 1-3 days  New goal(s):  Review of initial/current patient goals per problem list:  Please see plan of careInterdisciplinary Treatment Plan Update (Adult)  Attendees: Patient 04/01/2015 11:08 AM   Family:   04/01/2015 11:08 AM   Physician:  Nehemiah MassedFernando Cobos, MD 04/01/2015 11:08 AM   Nursing:   Linton RumpAmanda Lawson, RN 04/01/2015 11:08 AM   Clinical Social Worker:  Juline PatchQuylle Everson Mott, LCSW 04/01/2015 11:08 AM   Clinical Social Worker:  Belenda CruiseKristin Drinkard, LCSW-A 04/01/2015 11:08 AM   Case Manager:  Onnie BoerJennifer Clark, RN 04/01/2015 11:08 AM   Other:  Carney LivingBrittney Tyson, RN 04/01/2015 11:08 AM  Other:   04/01/2015  11:08 AM   Other:  04/01/2015  11:08 AM   Other:  04/01/2015 11:08 AM   Other:  04/01/2015 11:08 AM   Other:  Chad CordialValerie Enoch, [facility] Transition Team Coordinator 04/01/2015 11:08 AM   Other:   04/01/2015 11:08 AM   Other:  04/01/2015 11:08 AM   Other:   04/01/2015 11:08 AM    Scribe for Treatment Team:   Wynn BankerHodnett, Julious Langlois Hairston, 04/01/2015   11:08 AM

## 2015-04-02 LAB — GLUCOSE, CAPILLARY: Glucose-Capillary: 137 mg/dL — ABNORMAL HIGH (ref 65–99)

## 2015-04-02 MED ORDER — METFORMIN HCL 1000 MG PO TABS: 1000.0000 mg | ORAL_TABLET | Freq: Two times a day (BID) | ORAL | Status: AC

## 2015-04-02 MED ORDER — ZOLPIDEM TARTRATE 10 MG PO TABS: 10.0000 mg | ORAL_TABLET | Freq: Every evening | ORAL | Status: AC | PRN

## 2015-04-02 MED ORDER — ASPIRIN EC 81 MG PO TBEC: 81.0000 mg | DELAYED_RELEASE_TABLET | Freq: Every morning | ORAL | Status: AC

## 2015-04-02 MED ORDER — ACAMPROSATE CALCIUM 333 MG PO TBEC: 666.0000 mg | DELAYED_RELEASE_TABLET | Freq: Three times a day (TID) | ORAL | Status: AC

## 2015-04-02 MED ORDER — METOPROLOL SUCCINATE ER 50 MG PO TB24: 50.0000 mg | ORAL_TABLET | Freq: Every day | ORAL | Status: AC

## 2015-04-02 MED ORDER — SERTRALINE HCL 100 MG PO TABS: 100.0000 mg | ORAL_TABLET | Freq: Every day | ORAL | Status: AC

## 2015-04-02 MED ORDER — LISINOPRIL 40 MG PO TABS: 40.0000 mg | ORAL_TABLET | Freq: Every morning | ORAL | Status: AC

## 2015-04-02 MED ORDER — CLOPIDOGREL BISULFATE 75 MG PO TABS: 75.0000 mg | ORAL_TABLET | Freq: Every day | ORAL | Status: AC

## 2015-04-02 MED ORDER — INSULIN DETEMIR 100 UNIT/ML ~~LOC~~ SOLN: 50.0000 [IU] | Freq: Two times a day (BID) | SUBCUTANEOUS | Status: AC

## 2015-04-02 MED ORDER — ISOSORBIDE MONONITRATE ER 60 MG PO TB24: 60.0000 mg | ORAL_TABLET | Freq: Every morning | ORAL | Status: AC

## 2015-04-02 MED ORDER — INSULIN ASPART 100 UNIT/ML FLEXPEN: 5.0000 [IU] | PEN_INJECTOR | Freq: Three times a day (TID) | SUBCUTANEOUS | Status: AC

## 2015-04-02 MED ORDER — ATORVASTATIN CALCIUM 40 MG PO TABS: 40.0000 mg | ORAL_TABLET | Freq: Every evening | ORAL | Status: AC

## 2015-04-02 MED ORDER — NICOTINE 21 MG/24HR TD PT24: 21.0000 mg | MEDICATED_PATCH | Freq: Every day | TRANSDERMAL | Status: AC

## 2015-04-02 MED ORDER — EPLERENONE 50 MG PO TABS: 50.0000 mg | ORAL_TABLET | Freq: Every evening | ORAL | Status: AC

## 2015-04-02 MED ORDER — PANTOPRAZOLE SODIUM 40 MG PO TBEC
40.0000 mg | DELAYED_RELEASE_TABLET | Freq: Every day | ORAL | Status: AC
Start: 2015-04-02 — End: ?

## 2015-04-02 NOTE — Progress Notes (Signed)
  [facility] :  Will you be returning to the same living situation after discharge:  No. [facility] [place] At discharge, do you have transportation home?: Yes,  Pt provided with [employer] bus ticket Do you have the ability to pay for your medications: Yes,  Pt provided with samples and prescriptions  Release of information consent forms completed and in the chart;  Patient's signature needed at discharge.  Patient to Follow up at: Follow-up Information    Follow up with [facility] On 04/06/2015.   Why:  Please go to [facility]'s walk in clinic on Wednesday, April 06, 2015 or any weekday between 8AM - 3PM for medication management.   Contact information:   [address] [place], [place]   [id number]  [phone]      Patient denies SI/HI: Yes,  Pt denies    Safety Planning and Suicide Prevention discussed: Yes,  see SPE note for further details  Have you used any form of tobacco in the last 30 days? (Cigarettes, Smokeless Tobacco, Cigars, and/or Pipes): Yes  Has patient been referred to the Quitline?: N/A patient is not a smoker  Elaina HoopsCarter, Tiahna Cure M 04/02/2015, 12:38 PM

## 2015-04-02 NOTE — BHH Group Notes (Signed)
Pt did not attend wrap up group.   Gale Klar McNeil, MA OBS Counselor 

## 2015-04-02 NOTE — Progress Notes (Signed)
Writer has observed patient up in the dayroom watching tv and interacting with select peers. Writer spoke with him 1:1 and he reports that he is discharging on tomorrow and is concerned about his medications. Writer encouraged him to speak with his case manager about his arrangement for his medications. He denies si/hi/a/v hallucinations. Support and encouragement given, safety maintained on unit with 15 min checks.

## 2015-04-02 NOTE — BHH Group Notes (Signed)
BHH Group Notes:  (Nursing/MHT/Case Management/Adjunct)  Date:  04/02/2015  Time:  1000am  Type of Therapy:  Nurse Education  Participation Level:  None  Participation Quality:  Attentive  Affect:  Anxious  Cognitive:  Alert  Insight:  None  Engagement in Group:  Poor  Modes of Intervention:  Discussion, Education and Support  Summary of Progress/Problems: Patient attended group, remained engaged. Pt did not verbally participate.  Lendell CapriceGuthrie, Landis Dowdy A 04/02/2015, 11:03 AM

## 2015-04-02 NOTE — Progress Notes (Signed)
D: Patient is alert and oriented. Pt's mood and affect is depressed, anxious, and blunted. Pt denies SI/HI and AVH. Pt rates depression 2/10, hopelessness 8/10, and anxiety 8/10. Pt reports his goal for the day is "get out with meds." Pt is worried d/t discharge timing. Pt reports he needs to discharge today by 0930 in order to catch a bus. Pt experiencing HTN, denies symptoms (See docflowsheet-vitals). Pt is attending unit groups. A: Active listening by RN. Encouragement/Support provided to pt. Will reassess BP. RN consulting with LCSW Lauren in regards to pt's discharge status/plan. Medication education reviewed with pt. Scheduled medications administered per providers orders (See MAR). 15 minute checks continued per protocol for patient safety.  R: Patient cooperative and receptive to nursing interventions. Pt remains safe.

## 2015-04-02 NOTE — Progress Notes (Signed)
DISCHARGE NOTE: D: Patient was alert, oriented, and ambulatory with a steady gait upon discharge. Pt denies SI/HI and AVH. A: AVS reviewed and given to pt. Follow up reviewed with pt. Resources reviewed with pt, including [facility]. Prescriptions/Medications given to pt. Belongings returned to pt. Pt given time to ask questions and express concerns. Pt encouraged to follow up with PCP in regards to chronic medical concerns including BP and diabetes. R: Pt D/C'd to [employer]. Voucher presented to cab driver, cab #[phone].

## 2015-04-02 NOTE — BHH Suicide Risk Assessment (Signed)
[facility]   Demographic Factors:  Male, Adolescent or young adult, Caucasian, Low socioeconomic status and Unemployed  Total Time spent with patient: 30 minutes  Musculoskeletal: Strength & Muscle Tone: within normal limits Gait & Station: normal Patient leans: N/A  Psychiatric Specialty Exam: Physical Exam  ROS  Blood pressure 141/87, pulse 69, temperature 98.1 F (36.7 C), temperature source Oral, resp. rate 16, height 5\' 11"  (1.803 m), weight 109.77 kg (242 lb), SpO2 99 %.Body mass index is 33.77 kg/(m^2).  General Appearance: Casual  Eye Contact::  Good  Speech:  Clear and Coherent409  Volume:  Normal  Mood:  Euthymic  Affect:  Appropriate and Congruent  Thought Process:  Coherent and Goal Directed  Orientation:  Full (Time, Place, and Person)  Thought Content:  WDL  Suicidal Thoughts:  No  Homicidal Thoughts:  No  Memory:  Immediate;   Good Recent;   Good Remote;   Good  Judgement:  Intact  Insight:  Fair  Psychomotor Activity:  Normal  Concentration:  Good  Recall:  Good  Fund of Knowledge:Good  Language: Good  Akathisia:  Negative  Handed:  Right  AIMS (if indicated):     Assets:  Communication Skills Desire for Improvement Financial Resources/Insurance Housing Intimacy Leisure Time Physical Health Resilience Social Support Talents/Skills  Sleep:  Number of Hours: 6.75  Cognition: WNL  ADL's:  Intact   Have you used any form of tobacco in the last 30 days? (Cigarettes, Smokeless Tobacco, Cigars, and/or Pipes): Yes  Has this patient used any form of tobacco in the last 30 days? (Cigarettes, Smokeless Tobacco, Cigars, and/or Pipes) No  Mental Status Per Nursing Assessment::   On Admission:     Current Mental Status by Physician: Patient has fine mood and appropriate affect. He has no safety concerns and has a plan of attending  alcohol rehab residential treatment program in [place]. He has No SI/HI and psychosis.   Loss  Factors: Decrease in vocational status and Financial problems/change in socioeconomic status  Historical Factors: was adopted and his adopted family has no substance abuse or mental illness.   Risk Reduction Factors:   Sense of responsibility to family, Religious beliefs about death, Positive social support, Positive therapeutic relationship and Positive coping skills or problem solving skills  Continued Clinical Symptoms:  Severe Anxiety and/or Agitation Panic Attacks Depression:   Comorbid alcohol abuse/dependence Insomnia Recent sense of peace/wellbeing Severe Alcohol/Substance Abuse/Dependencies More than one psychiatric diagnosis Previous Psychiatric Diagnoses and Treatments Medical Diagnoses and Treatments/Surgeries  Cognitive Features That Contribute To Risk:  Polarized thinking    Suicide Risk:  Minimal: No identifiable suicidal ideation.  Patients presenting with no risk factors but with morbid ruminations; may be classified as minimal risk based on the severity of the depressive symptoms  Principal Problem: Panic disorder without agoraphobia with severe panic attacks Discharge Diagnoses:  Patient Active Problem List   Diagnosis Date Noted  . 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 Alcohol use disorder, moderate, dependence [F10.20] 03/28/2015  . 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 Generalized anxiety disorder [F41.1] 03/25/2015  . 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 LBBB  [I44.7] 03/22/2015  . ICM- EF 35-40% at cath 03/21/15 [I25.5] 03/22/2015  . Palpitations [R00.2]   . Non-STEMI- Troponin 0.23 [I21.4] 03/20/2015  . CAD S/P multiple prior PCIs, cath 02/2315- all patent [I25.10, Z98.61]   . Hypertension [I10]   .  Diabetes mellitus without complication [E11.9]   . Obesity (BMI 30-39.9) [E66.9]   . Hyperlipidemia [E78.5]     Follow-up  Information    Follow up with [facility] On 04/06/2015.   Why:  Please go to [facility]'s walk in clinic on Wednesday, April 06, 2015 or any weekday between 8AM - 3PM for medication management.   Contact information:   [address] [place], [place]   [address]  [phone]      Plan Of Care/Follow-up recommendations:  Activity:  as tolerated Diet:  diabetic diet  Is patient on multiple antipsychotic therapies at discharge:  No   Has Patient had three or more failed trials of antipsychotic monotherapy by history:  No  Recommended Plan for Multiple Antipsychotic Therapies: NA    Luceal Hollibaugh,JANARDHAHA R. 04/02/2015, 9:49 AM

## 2015-04-02 NOTE — Discharge Summary (Signed)
Physician Discharge Summary Note  Patient:  Jake Mayo is an 49 y.o., male MRN:  [id number] DOB:  10-20-1966 Patient phone:  [phone] (home)  Patient address:   [address] [place] [address],  Total Time spent with patient: 30 minutes  Date of Admission:  03/25/2015 Date of Discharge: 04/02/15  Reason for Admission:  Severe anxiety, Suicidal ideation  Principal Problem: Panic disorder without agoraphobia with severe panic attacks Discharge Diagnoses: Patient Active Problem List   Diagnosis Date Noted  . 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 Alcohol use disorder, moderate, dependence [F10.20] 03/28/2015  . 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 Generalized anxiety disorder [F41.1] 03/25/2015  . 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 LBBB  [I44.7] 03/22/2015  . ICM- EF 35-40% at cath 03/21/15 [I25.5] 03/22/2015  . Palpitations [R00.2]   . Non-STEMI- Troponin 0.23 [I21.4] 03/20/2015  . CAD S/P multiple prior PCIs, cath 02/2315- all patent [I25.10, Z98.61]   . Hypertension [I10]   . Diabetes mellitus without complication [E11.9]   . Obesity (BMI 30-39.9) [E66.9]   . Hyperlipidemia [E78.5]     Musculoskeletal: Strength & Muscle Tone: within normal limits Gait & Station: normal Patient leans: N/A  Psychiatric Specialty Exam: Physical Exam  Review of Systems  Constitutional: Negative.   HENT: Negative.   Eyes: Negative.   Respiratory: Negative.   Cardiovascular: Negative.   Gastrointestinal: Negative.   Genitourinary: Negative.   Musculoskeletal: Negative.   Skin: Negative.   Neurological: Negative.   Endo/Heme/Allergies: Negative.   Psychiatric/Behavioral: Positive for depression (Stabilized ). Negative for suicidal ideas, hallucinations, memory loss and substance abuse. The patient is not nervous/anxious and does not have  insomnia.     Blood pressure 141/87, pulse 69, temperature 98.1 F (36.7 C), temperature source Oral, resp. rate 16, height 5\' 11"  (1.803 m), weight 109.77 kg (242 lb), SpO2 99 %.Body mass index is 33.77 kg/(m^2).  See Physician SRA     Have you used any form of tobacco in the last 30 days? (Cigarettes, Smokeless Tobacco, Cigars, and/or Pipes): Yes  Has this patient used any form of tobacco in the last 30 days? (Cigarettes, Smokeless Tobacco, Cigars, and/or Pipes) Yes, A prescription for an FDA-approved tobacco cessation medication was offered at discharge and the patient refused  Past Medical History:  Past Medical History  Diagnosis Date  . Coronary artery disease   . Hypertension   . Diabetes mellitus without complication   . Obesity (BMI 30-39.9)   . Hyperlipidemia     Past Surgical History  Procedure Laterality Date  . Coronary angioplasty with stent placement    . Laparoscopic cholecystectomy    . Cardiac catheterization N/A 03/21/2015    Procedure: Left Heart Cath and Coronary Angiography;  Surgeon: Lyn Records, MD;  Location: [facility] INVASIVE CV LAB;  Service: Cardiovascular;  Laterality: N/A;   Family History: History reviewed. No pertinent family history. Social History:  History  Alcohol Use No     History  Drug Use No    History   Social History  . Marital Status: Married    Spouse Name: N/A  . Number of Children: N/A  . Years of Education: N/A   Social History Main Topics  . Smoking status: Former Smoker -- 1.00 packs/day for 30 years    Types: Cigarettes  . Smokeless tobacco: Never Used  . Alcohol Use: No  . Drug Use: No  .  Sexual Activity: Not on file   Other Topics Concern  . None   Social History Narrative   Truck driver      Risk to Self: Is patient at risk for suicide?: No What has been your use of drugs/alcohol within the last 12 months?: Haven't drank since last year when had heart attack.  Risk to Others:   Prior Inpatient Therapy:    Prior Outpatient Therapy:    Level of Care:  OP  Hospital Course:    Kamil Mchaffie is an 49 y.o. male. Pt presents under IVC to [facility] from [place]. Per chart review, pt transported from [place] d/t his medical conditions including HF, diabetes and having a cardiac stent. Pt is cooperative and oriented x 4 during teleassessment. Pt reports that he was speaking on the phone w/ a "social worker" from his insurance co when he expressed SI and the SW called EMS. He reports extreme anxiety with nightly panic attacks for the past two mos. Patient is tearful and states that he hasn't slept in 30 hrs. Pt denies HI. He denies [facility] and no delusions noted. Pt sts he moved from [place] to [place] to be near his parents who live in [place]. Pt sts if he can't reduce his anxiety, he plans to "take pills" or kill himself by carbon monoxide poisoning. He endorses one suicide attempt 25 yrs ago by ingesting "my heart medicine". He reports he was admitted to a MH facility after that attempt. Pt sts he doesn't have a psychiatrist or a therapist. Pt sts his girlfriend of 2 yrs left him two mos ago. He reports his father is ill. Pt endorses insomnia and fatigue. He says he has lost 8 lbs in past 10 days. He sts he is afraid to lie down at night because he has a panic attack every night while trying to fall asleep. Pt sts he has been to the ED in [place] several times in the past 2 mos and per chart review, pt has been to [facility] in the past few days for chest pain. He reports the MDs tell him that he is having panic attacks as opposed to the heart episodes he thinks he is having. Writer asks if he can keep himself safe if he is discharged. Pt reports, "If I'm discharged with the right medicine" he would be "okay". Pt sts that if he isn't discharged with medication to address his anxiety, then "it wouldn't be safe" for pt.          Jerel Sardina was admitted to the adult 400 unit. He was evaluated and his symptoms were  identified. Medication management was discussed and initiated. Patient was started on Zoloft 25 mg daily for depression/anxiety, which was increased to 100 mg daily by time of discharge. The medication Klonopin was used on a prn basis to address anxiety until his antidepressant became effective. He was oriented to the unit and encouraged to participate in unit programming. Medical problems were identified and treated appropriately. Home medication was restarted as needed.        The patient was evaluated each day by a clinical provider to ascertain the patient's response to treatment.  Improvement was noted by the patient's report of decreasing symptoms, improved sleep and appetite, affect, medication tolerance, behavior, and participation in unit programming.  He was asked each day to complete a self inventory noting mood, mental status, pain, new symptoms, anxiety and concerns.         He  responded well to medication and being in a therapeutic and supportive environment. Positive and appropriate behavior was noted and the patient was motivated for recovery.  The patient worked closely with the treatment team and case manager to develop a discharge plan with appropriate goals. Coping skills, problem solving as well as relaxation therapies were also part of the unit programming.         By the day of discharge he was in much improved condition than upon admission.  Symptoms were reported as significantly decreased or resolved completely. The patient denied SI/HI and voiced no AVH. He was motivated to continue taking medication with a goal of continued improvement in mental health.  Elmon Kirschner was discharged home with a plan to follow up as noted below. The patient was provided with sample medications and prescriptions at time of discharge. He left [facility] in stable condition with all belongings returned to him.    Consults:  cardiology and psychiatry  Significant Diagnostic Studies:  Blood glucose monitoring,  Chemistry panel, CBC, UDS, EKG, Cardiac cath/Echo done 03/21/15   Discharge Vitals:   Blood pressure 141/87, pulse 69, temperature 98.1 F (36.7 C), temperature source Oral, resp. rate 16, height  (1.803 m), weight 109.77 kg (242 lb), SpO2 99 %. Body mass index is 33.77 kg/(m^2). Lab Results:   Results for orders placed or performed during the hospital encounter of 03/25/15 (from the past 72 hour(s))  Glucose, capillary     Status: Abnormal   Collection Time: 03/30/15 11:46 AM  Result Value Ref Range   Glucose-Capillary 117 (H) 65 - 99 mg/dL   Comment 1 Notify RN    Comment 2 Document in Chart   Glucose, capillary     Status: Abnormal   Collection Time: 03/30/15  4:52 PM  Result Value Ref Range   Glucose-Capillary 113 (H) 65 - 99 mg/dL   Comment 1 Notify RN    Comment 2 Document in Chart   Glucose, capillary     Status: Abnormal   Collection Time: 03/30/15  9:02 PM  Result Value Ref Range   Glucose-Capillary 134 (H) 65 - 99 mg/dL  Glucose, capillary     Status: Abnormal   Collection Time: 03/31/15  6:08 AM  Result Value Ref Range   Glucose-Capillary 115 (H) 65 - 99 mg/dL  Glucose, capillary     Status: Abnormal   Collection Time: 03/31/15 12:01 PM  Result Value Ref Range   Glucose-Capillary 124 (H) 65 - 99 mg/dL   Comment 1 Notify RN    Comment 2 Document in Chart   Glucose, capillary     Status: Abnormal   Collection Time: 03/31/15  4:46 PM  Result Value Ref Range   Glucose-Capillary 195 (H) 65 - 99 mg/dL  Glucose, capillary     Status: Abnormal   Collection Time: 03/31/15  8:34 PM  Result Value Ref Range   Glucose-Capillary 128 (H) 65 - 99 mg/dL  Glucose, capillary     Status: Abnormal   Collection Time: 04/01/15  6:01 AM  Result Value Ref Range   Glucose-Capillary 114 (H) 65 - 99 mg/dL  Glucose, capillary     Status: Abnormal   Collection Time: 04/01/15 11:54 AM  Result Value Ref Range   Glucose-Capillary 100 (H) 65 - 99 mg/dL   Comment 1 Notify RN     Comment 2 Document in Chart   Glucose, capillary     Status: Abnormal   Collection Time: 04/01/15  5:03 PM  Result Value Ref Range   Glucose-Capillary 113 (H) 65 - 99 mg/dL   Comment 1 Notify RN    Comment 2 Document in Chart   Glucose, capillary     Status: Abnormal   Collection Time: 04/02/15  6:25 AM  Result Value Ref Range   Glucose-Capillary 137 (H) 65 - 99 mg/dL    Physical Findings: AIMS: Facial and Oral Movements Muscles of Facial Expression: None, normal Lips and Perioral Area: None, normal Jaw: None, normal Tongue: None, normal,Extremity Movements Upper (arms, wrists, hands, fingers): None, normal Lower (legs, knees, ankles, toes): None, normal, Trunk Movements Neck, shoulders, hips: None, normal, Overall Severity Severity of abnormal movements (highest score from questions above): None, normal Incapacitation due to abnormal movements: None, normal Patient's awareness of abnormal movements (rate only patient's report): No Awareness, Dental Status Current problems with teeth and/or dentures?: No Does patient usually wear dentures?: No  CIWA:  CIWA-Ar Total: 0 COWS:  COWS Total Score: 2   See Psychiatric Specialty Exam and Suicide Risk Assessment completed by Attending Physician prior to discharge.  [facility] [place]  Is patient on multiple antipsychotic therapies at discharge:  No   Has Patient had three or more failed trials of antipsychotic monotherapy by history:  No  Recommended Plan for Multiple Antipsychotic Therapies: NA  Discharge Instructions    Discharge instructions    Complete by:  As directed   Please follow up with your Primary Care Provider as scheduled for further management of chronic medical problems such as Diabetes and Hypertension as recommended by Dr. Jama Flavorsobos.            Medication List    STOP taking these medications        FLUoxetine 20 MG capsule  Commonly known as:  PROZAC      TAKE these  medications      Indication   acamprosate 333 MG tablet  Commonly known as:  CAMPRAL  Take 2 tablets (666 mg total) by mouth 3 (three) times daily with meals.   Indication:  Excessive Use of Alcohol     acetaminophen 325 MG tablet  Commonly known as:  TYLENOL  Take 2 tablets (650 mg total) by mouth every 4 (four) hours as needed for headache or mild pain.      aspirin EC 81 MG tablet  Take 1 tablet (81 mg total) by mouth every morning.   Indication:  Heart Attack     atorvastatin 40 MG tablet  Commonly known as:  LIPITOR  Take 1 tablet (40 mg total) by mouth every evening.   Indication:  Elevation of Both Cholesterol and Triglycerides in Blood     clopidogrel 75 MG tablet  Commonly known as:  PLAVIX  Take 1 tablet (75 mg total) by mouth daily with breakfast.   Indication:  Disease of the Peripheral Arteries     eplerenone 50 MG tablet  Commonly known as:  INSPRA  Take 1 tablet (50 mg total) by mouth every evening.   Indication:  High Blood Pressure     insulin aspart 100 UNIT/ML FlexPen  Commonly known as:  NOVOLOG  Inject 5 Units into the skin 3 (three) times daily with meals.   Indication:  Type 2 Diabetes     insulin detemir 100 UNIT/ML injection  Commonly known as:  LEVEMIR  Inject 0.5 mLs (50 Units total) into the skin 2 (two) times daily.   Indication:  Type 2 Diabetes  isosorbide mononitrate 60 MG 24 hr tablet  Commonly known as:  IMDUR  Take 1 tablet (60 mg total) by mouth every morning.   Indication:  Chronic Angina Pectoris     lisinopril 40 MG tablet  Commonly known as:  PRINIVIL,ZESTRIL  Take 1 tablet (40 mg total) by mouth every morning.   Indication:  High Blood Pressure     metFORMIN 1000 MG tablet  Commonly known as:  GLUCOPHAGE  Take 1 tablet (1,000 mg total) by mouth 2 (two) times daily with a meal.   Indication:  Type 2 Diabetes     metoprolol succinate 50 MG 24 hr tablet  Commonly known as:  TOPROL-XL  Take 1 tablet (50 mg total) by  mouth daily.   Indication:  High Blood Pressure     nicotine 21 mg/24hr patch  Commonly known as:  NICODERM CQ - dosed in mg/24 hours  Place 1 patch (21 mg total) onto the skin daily.   Indication:  Nicotine Addiction     nitroGLYCERIN 0.4 MG SL tablet  Commonly known as:  NITROSTAT  Place 1 tablet (0.4 mg total) under the tongue every 5 (five) minutes x 3 doses as needed for chest pain.      pantoprazole 40 MG tablet  Commonly known as:  PROTONIX  Take 1 tablet (40 mg total) by mouth daily at 12 noon.   Indication:  Gastroesophageal Reflux Disease     sertraline 100 MG tablet  Commonly known as:  ZOLOFT  Take 1 tablet (100 mg total) by mouth daily. For depression   Indication:  Major Depressive Disorder     zolpidem 10 MG tablet  Commonly known as:  AMBIEN  Take 1 tablet (10 mg total) by mouth at bedtime as needed for sleep.   Indication:  Trouble Sleeping           Follow-up Information    Follow up with [facility] On 04/06/2015.   Why:  Please go to [facility]'s walk in clinic on Wednesday, April 06, 2015 or any weekday between 8AM - 3PM for medication management.   Contact information:   [address] [place], [place]   [address]  [phone]      Follow-up recommendations:   Activity: as tolerated Diet: diabetic diet  Comments:   Take all your medications as prescribed by your mental healthcare provider.  Report any adverse effects and or reactions from your medicines to your outpatient provider promptly.  Patient is instructed and cautioned to not engage in alcohol and or illegal drug use while on prescription medicines.  In the event of worsening symptoms, patient is instructed to call the crisis hotline, 911 and or go to the nearest ED for appropriate evaluation and treatment of symptoms.  Follow-up with your primary care provider for your other medical issues, concerns and or health care needs.   Total Discharge Time: Greater than 30 minutes   Signed: DAVIS, LAURA  NP-C 04/02/2015, 9:07 AM   Patient seen for face to face psychiatric evaluation, completed discharge suicide risk assessment, and case discussed with physician extender, agree with disposition plan and discharge medications. Reviewed the information documented and agree with the treatment plan.   Eather Chaires,JANARDHAHA R. 04/04/2015 9:49 AM
